# Patient Record
Sex: Female | Born: 1990 | Race: White | Hispanic: No | Marital: Married | Smoking: Never smoker
Health system: Southern US, Community
[De-identification: ages and names within clinical notes are randomized; demographics above are authoritative.]

## PROBLEM LIST (undated history)

## (undated) ENCOUNTER — Emergency Department: Admission: EM | Payer: Self-pay | Source: Home / Self Care

## (undated) DIAGNOSIS — R519 Headache, unspecified: Secondary | ICD-10-CM

## (undated) DIAGNOSIS — F909 Attention-deficit hyperactivity disorder, unspecified type: Secondary | ICD-10-CM

## (undated) DIAGNOSIS — R112 Nausea with vomiting, unspecified: Secondary | ICD-10-CM

## (undated) DIAGNOSIS — L309 Dermatitis, unspecified: Secondary | ICD-10-CM

## (undated) DIAGNOSIS — Z9889 Other specified postprocedural states: Secondary | ICD-10-CM

## (undated) DIAGNOSIS — Z789 Other specified health status: Secondary | ICD-10-CM

## (undated) DIAGNOSIS — M205X1 Other deformities of toe(s) (acquired), right foot: Secondary | ICD-10-CM

## (undated) DIAGNOSIS — A64 Unspecified sexually transmitted disease: Secondary | ICD-10-CM

## (undated) HISTORY — PX: WISDOM TOOTH EXTRACTION: SHX21

---

## 2021-03-28 ENCOUNTER — Observation Stay
Admission: EM | Admit: 2021-03-28 | Discharge: 2021-03-28 | Disposition: A | Discharge status: Home / Self Care | Payer: Medicaid Other | Attending: Obstetrics and Gynecology | Admitting: Obstetrics and Gynecology

## 2021-03-28 ENCOUNTER — Encounter: Payer: Self-pay | Admitting: Obstetrics and Gynecology

## 2021-03-28 ENCOUNTER — Other Ambulatory Visit: Payer: Self-pay

## 2021-03-28 ENCOUNTER — Ambulatory Visit: Admit: 2021-03-28 | Payer: Self-pay

## 2021-03-28 ENCOUNTER — Emergency Department: Admission: EM | Admit: 2021-03-28 | Payer: Self-pay | Source: Home / Self Care

## 2021-03-28 DIAGNOSIS — R1012 Left upper quadrant pain: Secondary | ICD-10-CM | POA: Diagnosis not present

## 2021-03-28 DIAGNOSIS — O0001 Abdominal pregnancy with intrauterine pregnancy: Secondary | ICD-10-CM | POA: Insufficient documentation

## 2021-03-28 DIAGNOSIS — R1010 Upper abdominal pain, unspecified: Secondary | ICD-10-CM

## 2021-03-28 DIAGNOSIS — Z3A21 21 weeks gestation of pregnancy: Secondary | ICD-10-CM | POA: Diagnosis not present

## 2021-03-28 DIAGNOSIS — O26892 Other specified pregnancy related conditions, second trimester: Secondary | ICD-10-CM | POA: Diagnosis not present

## 2021-03-28 DIAGNOSIS — R109 Unspecified abdominal pain: Secondary | ICD-10-CM | POA: Diagnosis present

## 2021-03-28 HISTORY — DX: Other specified health status: Z78.9

## 2021-03-28 NOTE — Discharge Summary (Signed)
Physician Final Progress Note  Patient ID: Kimberly Ford MRN: [id number] DOB/AGE: Feb 09, 1991 30 y.o.  Admit date: 03/28/2021 Admitting provider: Conard Novak, MD Discharge date: 03/28/2021   Admission Diagnoses:  1) intrauterine pregnancy at [redacted]w[redacted]d  2) abdominal pain in pregnancy, second trimester  Discharge Diagnoses:  Active Problems:   Abdominal pain during pregnancy, second trimester   [redacted] weeks gestation of pregnancy    History of Present Illness: The patient is a 30 y.o. female G2P1001 at [redacted]w[redacted]d who presents for abdominal pain. She was lifting a piece of furniture when around [employer] she noted a "popping" sensation that was accompanied by upper abdominal pain.  Since that time she has had intermittent upper abdominal and left lateral abdominal pain. The pain is described as moderate.  Alleviating factors: being more still. Aggravating factors: walking, exertion. Associated symptoms: none. She notes occasional fetal movement - similar to prior to this episode.  She denies vaginal bleeding and leaking fluid. She denies discrete contractions.  This is her second pregnancy. She has had two dating scans and an anatomy scan at her OB/GYN in [place], where she plans to deliver [facility]). Her first pregnancy was complicated by a cesarean delivery due to fetal heart rate decelerations.   Past Medical History:  Diagnosis Date   No known health problems     Past Surgical History:  Procedure Laterality Date   CESAREAN SECTION      No current facility-administered medications on file prior to encounter.   No current outpatient medications on file prior to encounter.   Allergies: No Known Allergies  Social History   Socioeconomic History   Marital status: Married    Spouse name: Tory   Number of children: Not on file   Years of education: Not on file   Highest education level: Not on file  Occupational History   Not on file  Tobacco Use   Smoking status: Never     Passive exposure: Never   Smokeless tobacco: Never  Vaping Use   Vaping Use: Never used  Substance and Sexual Activity   Alcohol use: Not Currently   Drug use: Never   Sexual activity: Yes    Birth control/protection: None  Other Topics Concern   Not on file  Social History Narrative   Not on file   Social Determinants of Health   Financial Resource Strain: Not on file  Food Insecurity: Not on file  Transportation Needs: Not on file  Physical Activity: Not on file  Stress: Not on file  Social Connections: Not on file  Intimate Partner Violence: Not on file    Family History  Problem Relation Age of Onset   Cancer Neg Hx    Ovarian cancer Neg Hx    Breast cancer Neg Hx      Review of Systems  Constitutional: Negative.   HENT: Negative.    Eyes: Negative.   Respiratory: Negative.    Cardiovascular: Negative.   Gastrointestinal:  Positive for abdominal pain (see HPI). Negative for blood in stool, constipation, diarrhea, heartburn, melena, nausea and vomiting.  Genitourinary: Negative.   Musculoskeletal: Negative.   Skin: Negative.   Neurological: Negative.   Psychiatric/Behavioral: Negative.      Physical Exam: BP 119/62 (BP Location: Right Arm)   Pulse 86   Resp 16   Ht 5\' 3"  (1.6 m)   Wt 63.5 kg   BMI 24.80 kg/m   Physical Exam Constitutional:      General: She is not in  acute distress.    Appearance: Normal appearance. She is well-developed.  HENT:     Head: Normocephalic and atraumatic.  Eyes:     General: No scleral icterus.    Conjunctiva/sclera: Conjunctivae normal.  Cardiovascular:     Rate and Rhythm: Normal rate and regular rhythm.     Heart sounds: No murmur heard.   No friction rub. No gallop.  Pulmonary:     Effort: Pulmonary effort is normal. No respiratory distress.     Breath sounds: Normal breath sounds. No wheezing or rales.  Abdominal:     General: Bowel sounds are normal. There is no distension.     Palpations: Abdomen is soft.  There is mass (gravid, NT).     Tenderness: There is no abdominal tenderness. There is no guarding or rebound.  Musculoskeletal:        General: Normal range of motion.     Cervical back: Normal range of motion and neck supple.  Neurological:     General: No focal deficit present.     Mental Status: She is alert and oriented to person, place, and time.     Cranial Nerves: No cranial nerve deficit.  Skin:    General: Skin is warm and dry.     Findings: No erythema.  Psychiatric:        Mood and Affect: Mood normal.        Behavior: Behavior normal.        Judgment: Judgment normal.    Consults: None  Significant Findings/ Diagnostic Studies: none  Procedures:  Baseline FHR: 120 beats/min (fetal heart rate only due to gestational age) Variability: not determined Accelerations: not determined Decelerations: not determined Tocometry: quiet  Interpretation:  INDICATIONS: rule out uterine contractions RESULTS:  Reassuring fetal heart rate for gestational age  Bedside ultrasound: Single living intrauterine pregnancy with gestational age measured by femur length only of [redacted]w[redacted]d.  Fetus in footling breech presentation.  Placenta is anterior. No obvious evidence of accreta, though not particularly examined.  Fluid appears normal. Placenta appears well attached at all the edges.  A normal fetal heart rate noted.  Fetal movement noted throughout examination that lasted about 20 minutes.   Hospital Course: The patient was admitted to Labor and Delivery Triage for observation. She had normal vital signs.  She had reassuring tocometry without evidence of uterine contractions and abruption.  She had no tenderness on her abdomen while performing the ultrasound. There was fetal movement throughout the exam. She was offered further monitoring. However, due to reassuring assessment she opted for discharge with follow up with her OB/GYN in [place].   Discharge Condition: stable  Disposition:  Discharge disposition: 01-Home or Self Care      Diet: Regular diet  Discharge Activity: Activity as tolerated   Allergies as of 03/28/2021   No Known Allergies      Medication List    You have not been prescribed any medications.      Total time spent taking care of this patient: 50 minutes  Signed: Thomasene Mohair, MD  03/28/2021, 8:29 PM

## 2021-03-28 NOTE — Progress Notes (Signed)
Provider performed bedside ultrasound to ensure fetal wellness and to confirm that no issues were present with placenta. Fetal wellbeing confirmed and no issues visualized with placenta.   Pt. Stable to discharge home.

## 2021-03-28 NOTE — OB Triage Note (Signed)
Pt Kimberly Ford 30 y.o. presents to the ED complaining of abdominal pain. Pt is a G2P1 at 22w. Pt denies signs and symptoms consistent with rupture of membranes or active vaginal bleeding. Pt denies contractions and states positive fetal movement. Dopplar heart rate is 120s-130s Vital signs obtained and within normal limits. Provider notified of pt.

## 2021-03-28 NOTE — Progress Notes (Signed)
Pt states that she was moving furniture when she initially felt a pop in her upper abdomen/lower diaphragm. Now, her pain is also on the left side of her abdomen. She rates the pain as mild to moderate.   She attempted to be evaluated at a local urgent care in [facility], but they recommended that she be seen at the hospital due to lack of equipment available at the urgent care location.

## 2021-06-11 ENCOUNTER — Other Ambulatory Visit: Payer: Self-pay

## 2021-06-11 ENCOUNTER — Encounter: Payer: Self-pay | Admitting: Emergency Medicine

## 2021-06-11 ENCOUNTER — Emergency Department: Payer: Medicaid Other

## 2021-06-11 DIAGNOSIS — O99891 Other specified diseases and conditions complicating pregnancy: Secondary | ICD-10-CM | POA: Diagnosis present

## 2021-06-11 DIAGNOSIS — Z3A32 32 weeks gestation of pregnancy: Secondary | ICD-10-CM | POA: Diagnosis not present

## 2021-06-11 DIAGNOSIS — M79604 Pain in right leg: Secondary | ICD-10-CM | POA: Diagnosis not present

## 2021-06-11 NOTE — ED Triage Notes (Signed)
Pt in via POV, reports intermittent right lower leg pain, warm to touch x 2 days.  Pt expresses concern for possible dvt.  Pt is [redacted] weeks pregnant, denies any complications.

## 2021-06-12 ENCOUNTER — Emergency Department
Admission: EM | Admit: 2021-06-12 | Discharge: 2021-06-12 | Disposition: A | Discharge status: Home / Self Care | Payer: Medicaid Other | Attending: Emergency Medicine | Admitting: Emergency Medicine

## 2021-06-12 DIAGNOSIS — M79604 Pain in right leg: Secondary | ICD-10-CM

## 2021-06-12 NOTE — Discharge Instructions (Addendum)
As explained to you, your exam and ultrasound were both unremarkable.  Try to elevate your legs and massage them during the day.  Return to the emergency room if you notice swelling, worsening pain, redness, chest pain, shortness of breath.  Otherwise follow-up with your doctor in 3 days

## 2021-06-12 NOTE — ED Provider Notes (Signed)
[facility] Emergency Department Provider Note  ____________________________________________  Time seen: Approximately 2:38 AM  I have reviewed the triage vital signs and the nursing notes.   HISTORY  Chief Complaint Leg Pain   HPI Kimberly Ford is a 30 y.o. female with no significant past medical history currently [redacted] weeks pregnant who presents for evaluation of right leg pain.  Patient reports that over the last 2 days she has had intermittent episodes where she feels that her right lower leg is hot.  She has not looked at the leg to see if it looks red when this happens.  She called a friend who is a Engineer, civil (consulting) and she told patient to come to the emergency room to be rule out for DVT.  She does have mild soreness of her leg which has been ongoing for several weeks and she attributed to the pregnancy.  She has family history of blood clots in her mother but she has never had 1.  She denies any chest pain or shortness of breath.   Past Medical History:  Diagnosis Date   No known health problems     Patient Active Problem List   Diagnosis Date Noted   Abdominal pain during pregnancy, second trimester 03/28/2021   [redacted] weeks gestation of pregnancy 03/28/2021    Past Surgical History:  Procedure Laterality Date   CESAREAN SECTION      Prior to Admission medications   Not on File    Allergies Patient has no known allergies.  Family History  Problem Relation Age of Onset   Cancer Neg Hx    Ovarian cancer Neg Hx    Breast cancer Neg Hx     Social History Social History   Tobacco Use   Smoking status: Never    Passive exposure: Never   Smokeless tobacco: Never  Vaping Use   Vaping Use: Never used  Substance Use Topics   Alcohol use: Not Currently   Drug use: Never    Review of Systems  Constitutional: Negative for fever. Eyes: Negative for visual changes. ENT: Negative for sore throat. Neck: No neck pain  Cardiovascular: Negative for  chest pain. Respiratory: Negative for shortness of breath. Gastrointestinal: Negative for abdominal pain, vomiting or diarrhea. Genitourinary: Negative for dysuria. Musculoskeletal: Negative for back pain. + RLE pain Skin: Negative for rash. Neurological: Negative for headaches, weakness or numbness. Psych: No SI or HI  ____________________________________________   PHYSICAL EXAM:  VITAL SIGNS: ED Triage Vitals  Enc Vitals Group     BP 06/11/21 2249 120/69     Pulse Rate 06/11/21 2249 (!) 114     Resp 06/11/21 2249 16     Temp 06/11/21 2249 98.7 F (37.1 C)     Temp Source 06/11/21 2249 Oral     SpO2 06/11/21 2249 99 %     Weight 06/11/21 2250 152 lb (68.9 kg)     Height 06/11/21 2250 5\' 3"  (1.6 m)     Head Circumference --      Peak Flow --      Pain Score 06/11/21 2250 4     Pain Loc --      Pain Edu? --      Excl. in [place]? --     Constitutional: Alert and oriented. Well appearing and in no apparent distress. HEENT:      Head: Normocephalic and atraumatic.         Eyes: Conjunctivae are normal. Sclera is non-icteric.  Mouth/Throat: Mucous membranes are moist.       Neck: Supple with no signs of meningismus. Cardiovascular: Regular rate and rhythm. No murmurs, gallops, or rubs.  Respiratory: Normal respiratory effort. Lungs are clear to auscultation bilaterally.  Gastrointestinal: Gravid, non tender, and non distended with positive bowel sounds. No rebound or guarding. Musculoskeletal:  No edema, cyanosis, or erythema of extremities.  Strong distal pulses and brisk cap refill Neurologic: Normal speech and language. Face is symmetric. Moving all extremities. No gross focal neurologic deficits are appreciated. Skin: Skin is warm, dry and intact. No rash noted. Psychiatric: Mood and affect are normal. Speech and behavior are normal.  ____________________________________________   LABS (all labs ordered are listed, but only abnormal results are displayed)  Labs  Reviewed - No data to display ____________________________________________  EKG  none  ____________________________________________  RADIOLOGY  I have personally reviewed the images performed during this visit and I agree with the Radiologist's read.   Interpretation by Radiologist:  US Venous Img Lower Unilateral Right  Result Date: 06/11/2021 CLINICAL DATA:  Right leg pain EXAM: RIGHT LOWER EXTREMITY VENOUS DOPPLER ULTRASOUND TECHNIQUE: Gray-scale sonography with compression, as well as color and duplex ultrasound, were performed to evaluate the deep venous system(s) from the level of the common femoral vein through the popliteal and proximal calf veins. COMPARISON:  None. FINDINGS: VENOUS Normal compressibility of the common femoral, superficial femoral, and popliteal veins, as well as the visualized calf veins. Visualized portions of profunda femoral vein and great saphenous vein unremarkable. No filling defects to suggest DVT on grayscale or color Doppler imaging. Doppler waveforms show normal direction of venous flow, normal respiratory plasticity and response to augmentation. Limited views of the contralateral common femoral vein are unremarkable. OTHER None. Limitations: none IMPRESSION: Negative. Electronically Signed   By: Helyn Numbers M.D.   On: 06/11/2021 23:59    ____________________________________________   PROCEDURES  Procedure(s) performed: None Procedures Critical Care performed:  None ____________________________________________   INITIAL IMPRESSION / ASSESSMENT AND PLAN / ED COURSE  30 y.o. female with no significant past medical history currently [redacted] weeks pregnant who presents for evaluation of right leg pain and warmth sensation x 2 days.  On exam patient is well-appearing in no distress, slightly tachycardic most likely physiologic from pregnancy.  Exam of her legs was completely normal showing no pitting edema, no signs of cellulitis, no ischemia, no swelling.   She has strong distal pulses and brisk capillary refill.  Venous Doppler study negative for DVT.  Symptoms of intermittent warmth sensation to the leg can be nerve pain or may be mild swelling from pregnancy during the day.  There is no swelling right now.  Recommended follow-up with her primary care doctor in 2 days.  Discussed my standard return precautions for new or worsening leg pain, swelling, chest pain or shortness of breath.  Fetal heart rate of 132.      _____________________________________________ Please note:  Patient was evaluated in Emergency Department today for the symptoms described in the history of present illness. Patient was evaluated in the context of the global COVID-19 pandemic, which necessitated consideration that the patient might be at risk for infection with the SARS-CoV-2 virus that causes COVID-19. Institutional protocols and algorithms that pertain to the evaluation of patients at risk for COVID-19 are in a state of rapid change based on information released by regulatory bodies including the CDC and federal and state organizations. These policies and algorithms were followed during the patient's care in the  ED.  Some ED evaluations and interventions may be delayed as a result of limited staffing during the pandemic.   [place] Controlled Substance Database was reviewed by me. ____________________________________________   FINAL CLINICAL IMPRESSION(S) / ED DIAGNOSES   Final diagnoses:  Right leg pain      NEW MEDICATIONS STARTED DURING THIS VISIT:  ED Discharge Orders     None        Note:  This document was prepared using Dragon voice recognition software and may include unintentional dictation errors.    Nita Sickle, MD 06/12/21 [id number]

## 2021-12-28 ENCOUNTER — Ambulatory Visit
Admission: EM | Admit: 2021-12-28 | Discharge: 2021-12-28 | Disposition: A | Discharge status: Home / Self Care | Payer: Medicaid Other

## 2021-12-28 ENCOUNTER — Other Ambulatory Visit: Payer: Self-pay

## 2021-12-28 DIAGNOSIS — J0100 Acute maxillary sinusitis, unspecified: Secondary | ICD-10-CM

## 2021-12-28 MED ORDER — AMOXICILLIN-POT CLAVULANATE 875-125 MG PO TABS: 1.0000 | ORAL_TABLET | Freq: Two times a day (BID) | ORAL | 0 refills | Status: AC

## 2021-12-28 MED ORDER — IPRATROPIUM BROMIDE 0.06 % NA SOLN
2.0000 | Freq: Four times a day (QID) | NASAL | 12 refills | Status: DC
Start: 2021-12-28 — End: 2023-10-08

## 2021-12-28 NOTE — ED Provider Notes (Signed)
[facility]    CSN: [id number] Arrival date & time: 12/28/21  1049      History   Chief Complaint Chief Complaint  Patient presents with   Nasal Congestion    HPI Kimberly Ford is a 31 y.o. female.   HPI  31 year old female here for evaluation of sinus complaints.  That she has been experiencing pain and pressure in her sinuses between her eyes and behind her eyes for the past 3 weeks.  She states that her symptoms began as a cold with runny nose and nasal congestion but the runny nose has resolved.  She also had a cough but that too has resolved except for first in the morning.  Her sputum production is a mixture of clear and what she describes as "chunky".  She has not had a fever, denies ear pain, sore throat, or GI complaints.  Past Medical History:  Diagnosis Date   No known health problems     Patient Active Problem List   Diagnosis Date Noted   Abdominal pain during pregnancy, second trimester 03/28/2021   [redacted] weeks gestation of pregnancy 03/28/2021    Past Surgical History:  Procedure Laterality Date   CESAREAN SECTION      OB History     Gravida  2   Para  1   Term  1   Preterm      AB      Living  1      SAB      IAB      Ectopic      Multiple      Live Births  1            Home Medications    Prior to Admission medications   Medication Sig Start Date End Date Taking? Authorizing Provider  amoxicillin-clavulanate (AUGMENTIN) 875-125 MG tablet Take 1 tablet by mouth every 12 (twelve) hours for 10 days. 12/28/21 01/07/22 Yes Margarette Canada, NP  ipratropium (ATROVENT) 0.06 % nasal spray Place 2 sprays into both nostrils 4 (four) times daily. 12/28/21  Yes Margarette Canada, NP  Multiple Vitamin (MULTIVITAMIN) capsule Take 1 capsule by mouth daily.   Yes [provider]  CALCIUM PO Take by mouth.    [provider]  Magnesium 200 MG TABS Take by mouth.    [provider]  ranitidine (ZANTAC) 150 MG  tablet Take by mouth.    [provider]    Family History Family History  Problem Relation Age of Onset   Cancer Neg Hx    Ovarian cancer Neg Hx    Breast cancer Neg Hx     Social History Social History   Tobacco Use   Smoking status: Never    Passive exposure: Never   Smokeless tobacco: Never  Vaping Use   Vaping Use: Never used  Substance Use Topics   Alcohol use: Not Currently   Drug use: Never     Allergies   Patient has no known allergies.   Review of Systems Review of Systems  Constitutional:  Negative for fever.  HENT:  Positive for congestion, hearing loss, sinus pressure and sinus pain. Negative for ear pain and sore throat.   Respiratory:  Positive for cough. Negative for shortness of breath and wheezing.     Physical Exam Triage Vital Signs ED Triage Vitals  Enc Vitals Group     BP 12/28/21 1101 101/65     Pulse Rate 12/28/21 1101 87  Resp 12/28/21 1101 18     Temp 12/28/21 1101 97.7 F (36.5 C)     Temp Source 12/28/21 1101 Oral     SpO2 12/28/21 1101 99 %     Weight 12/28/21 1058 125 lb (56.7 kg)     Height 12/28/21 1058 _0  (1.6 m)     Head Circumference --      Peak Flow --      Pain Score 12/28/21 1058 3     Pain Loc --      Pain Edu? --      Excl. in [place]? --    No data found.  Updated Vital Signs BP 101/65 (BP Location: Left Arm) Comment (BP Location): over single sleeve   Pulse 87    Temp 97.7 F (36.5 C) (Oral)    Resp 18    Ht _1  (1.6 m)    Wt 125 lb (56.7 kg)    LMP  (LMP Unknown)    SpO2 99%    Breastfeeding Yes    BMI 22.14 kg/m   Visual Acuity Right Eye Distance:   Left Eye Distance:   Bilateral Distance:    Right Eye Near:   Left Eye Near:    Bilateral Near:     Physical Exam Vitals and nursing note reviewed.  Constitutional:      Appearance: Normal appearance. She is obese. She is not ill-appearing.  HENT:     Head: Normocephalic and atraumatic.     Right Ear: Tympanic membrane, ear canal and  external ear normal. There is no impacted cerumen.     Left Ear: Tympanic membrane, ear canal and external ear normal. There is no impacted cerumen.     Nose: Congestion and rhinorrhea present.     Mouth/Throat:     Mouth: Mucous membranes are moist.     Pharynx: Oropharynx is clear. No posterior oropharyngeal erythema.  Cardiovascular:     Rate and Rhythm: Normal rate and regular rhythm.     Pulses: Normal pulses.     Heart sounds: Normal heart sounds. No murmur heard.   No friction rub. No gallop.  Pulmonary:     Effort: Pulmonary effort is normal.     Breath sounds: Normal breath sounds. No wheezing, rhonchi or rales.  Musculoskeletal:     Cervical back: Normal range of motion and neck supple.  Lymphadenopathy:     Cervical: No cervical adenopathy.  Skin:    General: Skin is warm and dry.     Capillary Refill: Capillary refill takes less than 2 seconds.     Findings: No erythema or rash.  Neurological:     General: No focal deficit present.     Mental Status: She is alert and oriented to person, place, and time.  Psychiatric:        Mood and Affect: Mood normal.        Behavior: Behavior normal.        Thought Content: Thought content normal.        Judgment: Judgment normal.     UC Treatments / Results  Labs (all labs ordered are listed, but only abnormal results are displayed) Labs Reviewed - No data to display  EKG   Radiology No results found.  Procedures Procedures (including critical care time)  Medications Ordered in UC Medications - No data to display  Initial Impression / Assessment and Plan / UC Course  I have reviewed the triage vital signs and the nursing notes.  Pertinent labs & imaging results that were available during my care of the patient were reviewed by me and considered in my medical decision making (see chart for details).  Patient is a nontoxic-appearing 31 year old female here for evaluation of sinus complaints as outlined HPI above.   Patient is currently breast-feeding as well.  Physical exam reveals pearly-gray tympanic membranes bilaterally with normal reflex and clear external ear canals.  Nasal mucosa is erythematous and edematous.  I am unable to see her turbinates or sinus outlets.  No purulent discharge noted in the nasal passages.  Oropharyngeal exam is benign.  No cervical lymphadenopathy on exam.  Cardiopulmonary exam reveals clear lung sounds all fields.  Patient does have tenderness to percussion of maxillary sinuses.  Suspect patient has developed a sinus infection secondary to an upper respiratory infection she had 3 weeks ago.  We will do a trial of Augmentin twice daily for 10 days.  Per up-to-date this is compatible with breast-feeding.  I have also prescribed Atrovent nasal spray that patient can use 2 squirts up each nostril 4 times a day to help with nasal congestion.  Sinus irrigation also encouraged.  Patient to return for reevaluation, or see your PCP, for new or worsening symptoms.   Final Clinical Impressions(s) / UC Diagnoses   Final diagnoses:  Acute non-recurrent maxillary sinusitis     Discharge Instructions      The Augmentin twice daily with food for 10 days for treatment of your sinusitis.  Perform sinus irrigation 2-3 times a day with a [employer]Med sinus rinse kit and distilled water.  Do not use tap water.  You can use plain over-the-counter Mucinex every 6 hours to break up the stickiness of the mucus so your body can clear it.  Increase your oral fluid intake to thin out your mucus so that is also able for your body to clear more easily.  Take an over-the-counter probiotic, such as Culturelle-align-activia, 1 hour after each dose of antibiotic to prevent diarrhea.  If you develop any new or worsening symptoms return for reevaluation or see your primary care provider.      ED Prescriptions     Medication Sig Dispense Auth. Provider   amoxicillin-clavulanate (AUGMENTIN) 875-125 MG  tablet Take 1 tablet by mouth every 12 (twelve) hours for 10 days. 20 tablet Margarette Canada, NP   ipratropium (ATROVENT) 0.06 % nasal spray Place 2 sprays into both nostrils 4 (four) times daily. 15 mL Margarette Canada, NP      PDMP not reviewed this encounter.   Margarette Canada, NP 12/28/21 1156

## 2021-12-28 NOTE — ED Triage Notes (Signed)
Patient is here for "sinus pain for 3 wks". Concerned for "sinus infection". Cough "at first" now improved. Some runny nose as well. No itchy watery eyes.  ?

## 2021-12-28 NOTE — Discharge Instructions (Signed)
The Augmentin twice daily with food for 10 days for treatment of your sinusitis.  Perform sinus irrigation 2-3 times a day with a [employer] sinus rinse kit and distilled water.  Do not use tap water.  You can use plain over-the-counter Mucinex every 6 hours to break up the stickiness of the mucus so your body can clear it.  Increase your oral fluid intake to thin out your mucus so that is also able for your body to clear more easily.  Take an over-the-counter probiotic, such as Culturelle-align-activia, 1 hour after each dose of antibiotic to prevent diarrhea.  If you develop any new or worsening symptoms return for reevaluation or see your primary care provider.  

## 2022-01-07 ENCOUNTER — Encounter: Payer: Self-pay | Admitting: Emergency Medicine

## 2022-01-07 ENCOUNTER — Other Ambulatory Visit: Payer: Self-pay

## 2022-01-07 ENCOUNTER — Ambulatory Visit
Admission: EM | Admit: 2022-01-07 | Discharge: 2022-01-07 | Disposition: A | Discharge status: Home / Self Care | Payer: Medicaid Other | Attending: Physician Assistant | Admitting: Physician Assistant

## 2022-01-07 DIAGNOSIS — J019 Acute sinusitis, unspecified: Secondary | ICD-10-CM

## 2022-01-07 DIAGNOSIS — J3489 Other specified disorders of nose and nasal sinuses: Secondary | ICD-10-CM

## 2022-01-07 DIAGNOSIS — R0981 Nasal congestion: Secondary | ICD-10-CM | POA: Diagnosis not present

## 2022-01-07 MED ORDER — TRIAMCINOLONE ACETONIDE 55 MCG/ACT NA AERO: 2.0000 | INHALATION_SPRAY | Freq: Every day | NASAL | 0 refills | Status: DC

## 2022-01-07 MED ORDER — PREDNISONE 20 MG PO TABS: 40.0000 mg | ORAL_TABLET | Freq: Every day | ORAL | 0 refills | Status: AC

## 2022-01-07 NOTE — Discharge Instructions (Signed)
-  I have sent prednisone to help with your sinus infection.  Make sure you wait at least 4 hours before you breast-feed after taking.  Stay hydrated. ?- Have also sent Nasacort.  You can do nasal saline and Nettie pot as well.  Consider Afrin OTC as well.  Use this for short time, few days. ?- Can take over-the-counter Sudafed.  If it dries your milk supply too much, discontinue. ?- If not feeling better over the next 1 to 2 weeks or symptoms worsen, you may need to see an ENT specialist. ?

## 2022-01-07 NOTE — ED Provider Notes (Signed)
?[facility] ? ? ? ?CSN: [id number] ?Arrival date & time: 01/07/22  0957 ? ? ?  ? ?History   ?Chief Complaint ?Chief Complaint  ?Patient presents with  ? Facial Pain  ? ? ?HPI ?Kimberly Ford is a 31 y.o. female who is a breast-feeding mother.  Patient returns today for continued sinus congestion and pressure.  Patient seen a week and a half ago and suspected to have bacterial sinusitis.  Treated with Augmentin.  Reports she has 1 pill left.  States she continues to have sinus pain over bilateral frontal and maxillary regions.  She says that her nasal drainage is clear.  Reports postnasal drainage and a slight cough.  No associated fevers or difficulty breathing.  Patient reports she has not really been taking anything other than the antibiotic.  Reports she is afraid to take any decongestants because she is worried it may dry up her milk supply.  She also says she does not like nasal sprays so she has not been using those.  Does not report a history of allergies or sinus infections.  No other concerns. ? ?HPI ? ?Past Medical History:  ?Diagnosis Date  ? No known health problems   ? ? ?Patient Active Problem List  ? Diagnosis Date Noted  ? Abdominal pain during pregnancy, second trimester 03/28/2021  ? [redacted] weeks gestation of pregnancy 03/28/2021  ? ? ?Past Surgical History:  ?Procedure Laterality Date  ? CESAREAN SECTION    ? ? ?OB History   ? ? Gravida  ?2  ? Para  ?1  ? Term  ?1  ? Preterm  ?   ? AB  ?   ? Living  ?1  ?  ? ? SAB  ?   ? IAB  ?   ? Ectopic  ?   ? Multiple  ?   ? Live Births  ?1  ?   ?  ?  ? ? ? ?Home Medications   ? ?Prior to Admission medications   ?Medication Sig Start Date End Date Taking? Authorizing Provider  ?amoxicillin-clavulanate (AUGMENTIN) 875-125 MG tablet Take 1 tablet by mouth every 12 (twelve) hours for 10 days. 12/28/21 01/07/22 Yes Margarette Canada, NP  ?CALCIUM PO Take by mouth.   Yes [provider]  ?Magnesium 200 MG TABS Take by mouth.   Yes [provider]   ?Multiple Vitamin (MULTIVITAMIN) capsule Take 1 capsule by mouth daily.   Yes [provider]  ?ranitidine (ZANTAC) 150 MG tablet Take by mouth.   Yes [provider]  ?ipratropium (ATROVENT) 0.06 % nasal spray Place 2 sprays into both nostrils 4 (four) times daily. 12/28/21   Margarette Canada, NP  ?predniSONE (DELTASONE) 20 MG tablet Take 2 tablets (40 mg total) by mouth daily with breakfast for 5 days. 01/07/22 01/12/22 Yes Laurene Footman B, PA-C  ?triamcinolone (NASACORT) 55 MCG/ACT AERO nasal inhaler Place 2 sprays into the nose daily. 01/07/22  Yes Danton Clap, PA-C  ? ? ?Family History ?Family History  ?Problem Relation Age of Onset  ? Cancer Neg Hx   ? Ovarian cancer Neg Hx   ? Breast cancer Neg Hx   ? ? ?Social History ?Social History  ? ?Tobacco Use  ? Smoking status: Never  ?  Passive exposure: Never  ? Smokeless tobacco: Never  ?Vaping Use  ? Vaping Use: Never used  ?Substance Use Topics  ? Alcohol use: Not Currently  ? Drug use: Never  ? ? ? ?Allergies   ?  Patient has no known allergies. ? ? ?Review of Systems ?Review of Systems  ?Constitutional:  Negative for chills, diaphoresis, fatigue and fever.  ?HENT:  Positive for congestion, postnasal drip, rhinorrhea, sinus pressure and sinus pain. Negative for ear pain and sore throat.   ?Respiratory:  Positive for cough. Negative for shortness of breath.   ?Gastrointestinal:  Negative for abdominal pain, nausea and vomiting.  ?Musculoskeletal:  Negative for arthralgias and myalgias.  ?Skin:  Negative for rash.  ?Neurological:  Negative for weakness and headaches.  ?Hematological:  Negative for adenopathy.  ? ? ?Physical Exam ?Triage Vital Signs ?ED Triage Vitals  ?Enc Vitals Group  ?   BP 01/07/22 1020 102/72  ?   Pulse Rate 01/07/22 1020 84  ?   Resp 01/07/22 1020 18  ?   Temp 01/07/22 1020 98.3 ?F (36.8 ?C)  ?   Temp Source 01/07/22 1020 Oral  ?   SpO2 01/07/22 1020 99 %  ?   Weight 01/07/22 1017 125 lb (56.7 kg)  ?   Height 01/07/22 1017 5\' 3"   (1.6 m)  ?   Head Circumference --   ?   Peak Flow --   ?   Pain Score 01/07/22 1017 4  ?   Pain Loc --   ?   Pain Edu? --   ?   Excl. in [place]? --   ? ?No data found. ? ?Updated Vital Signs ?BP 102/72 (BP Location: Left Arm)   Pulse 84   Temp 98.3 ?F (36.8 ?C) (Oral)   Resp 18   Ht 5\' 3"  (1.6 m)   Wt 125 lb (56.7 kg)   LMP  (LMP Unknown)   SpO2 99%   Breastfeeding Yes   BMI 22.14 kg/m?  ?   ? ?Physical Exam ?Vitals and nursing note reviewed.  ?Constitutional:   ?   General: She is not in acute distress. ?   Appearance: Normal appearance. She is not ill-appearing or toxic-appearing.  ?HENT:  ?   Head: Normocephalic and atraumatic.  ?   Right Ear: Tympanic membrane, ear canal and external ear normal.  ?   Left Ear: Tympanic membrane, ear canal and external ear normal.  ?   Nose: Mucosal edema, congestion and rhinorrhea (trace clear drainage) present.  ?   Mouth/Throat:  ?   Mouth: Mucous membranes are moist.  ?   Pharynx: Oropharynx is clear.  ?   Comments: Mild clear PND ?Eyes:  ?   General: No scleral icterus.    ?   Right eye: No discharge.     ?   Left eye: No discharge.  ?   Conjunctiva/sclera: Conjunctivae normal.  ?Cardiovascular:  ?   Rate and Rhythm: Normal rate and regular rhythm.  ?   Heart sounds: Normal heart sounds.  ?Pulmonary:  ?   Effort: Pulmonary effort is normal. No respiratory distress.  ?   Breath sounds: Normal breath sounds.  ?Musculoskeletal:  ?   Cervical back: Neck supple.  ?Skin: ?   General: Skin is dry.  ?Neurological:  ?   General: No focal deficit present.  ?   Mental Status: She is alert. Mental status is at baseline.  ?   Motor: No weakness.  ?   Gait: Gait normal.  ?Psychiatric:     ?   Mood and Affect: Mood normal.     ?   Behavior: Behavior normal.     ?   Thought Content: Thought content normal.  ? ? ? ?  UC Treatments / Results  ?Labs ?(all labs ordered are listed, but only abnormal results are displayed) ?Labs Reviewed - No data to display ? ?EKG ? ? ?Radiology ?No results  found. ? ?Procedures ?Procedures (including critical care time) ? ?Medications Ordered in UC ?Medications - No data to display ? ?Initial Impression / Assessment and Plan / UC Course  ?I have reviewed the triage vital signs and the nursing notes. ? ?Pertinent labs & imaging results that were available during my care of the patient were reviewed by me and considered in my medical decision making (see chart for details). ? ?31 year old breast-feeding mother presenting for 5-week history of sinus congestion and pressure.  Patient recently completing 10-day course of Augmentin.  Reports symptoms no better.  Has not tried anything else for symptoms.  Patient is overall well-appearing today and has normal vital signs.  On exam she does have nasal congestion and mucosal edema with clear drainage.  Also clear postnasal drainage.  Chest is clear to auscultation heart regular rate and rhythm.  Patient does not report any history of allergies but I question if her symptoms may be due to allergies and an antihistamine/decongestant may be helpful as well as a nasal spray.  I understand that since she is breast-feeding she is reluctant to take any of those medicines as it may dry up her milk supply.  I have sent short course of prednisone to pharmacy to see if that will help and advised her to wait 4 hours before breast-feeding.  Also advised her to try the Nasacort I sent in nasal saline as well as over-the-counter Sudafed sparingly.  Reviewed return and ER precautions.  We will ? ? ?Final Clinical Impressions(s) / UC Diagnoses  ? ?Final diagnoses:  ?Acute sinusitis, recurrence not specified, unspecified location  ?Nasal congestion  ?Sinus pain  ? ? ? ?Discharge Instructions   ? ?  ?-I have sent prednisone to help with your sinus infection.  Make sure you wait at least 4 hours before you breast-feed after taking.  Stay hydrated. ?- Have also sent Nasacort.  You can do nasal saline and Nettie pot as well.  Consider Afrin OTC as  well.  Use this for short time, few days. ?- Can take over-the-counter Sudafed.  If it dries your milk supply too much, discontinue. ?- If not feeling better over the next 1 to 2 weeks or symptoms worsen, you

## 2022-01-07 NOTE — ED Triage Notes (Signed)
Pt c/o sinus pain/pressure. Started about 5 weeks ago. She states she was seen last week and given an antibiotic. She states she was getting better but now she is having sinus pain.pressure worse than she was before taking the antibiotic.  ?

## 2022-03-19 ENCOUNTER — Ambulatory Visit: Payer: Self-pay

## 2022-04-06 ENCOUNTER — Ambulatory Visit
Admission: EM | Admit: 2022-04-06 | Discharge: 2022-04-06 | Disposition: A | Discharge status: Home / Self Care | Payer: Medicaid Other | Attending: Physician Assistant | Admitting: Physician Assistant

## 2022-04-06 DIAGNOSIS — R21 Rash and other nonspecific skin eruption: Secondary | ICD-10-CM | POA: Diagnosis not present

## 2022-04-06 DIAGNOSIS — N61 Mastitis without abscess: Secondary | ICD-10-CM

## 2022-04-06 MED ORDER — NYSTATIN 100000 UNIT/GM EX CREA: TOPICAL_CREAM | CUTANEOUS | 0 refills | Status: AC

## 2022-04-06 MED ORDER — CEPHALEXIN 500 MG PO CAPS: 500.0000 mg | ORAL_CAPSULE | Freq: Four times a day (QID) | ORAL | 0 refills | Status: AC

## 2022-04-06 NOTE — ED Provider Notes (Signed)
[facility]    CSN: [id number] Arrival date & time: 04/06/22  1540      History   Chief Complaint No chief complaint on file.   HPI Kimberly Ford is a 31 y.o. female presenting for 1 to 1.5-week history of yellow crusting around the areola and breast on the left side.  She also reports that it is very tender to touch.  States she has yellow crusting in her bra.  She is a breast-feeding mother.  Her baby is 58 months old.  She denies any issues with her baby.  States it does not have thrush and has been eating and drinking normally.  She says she has not been feeding the baby from the left breast and only the right breast.  Reports reduced milk supply from the right breast.  She does not report any fevers or systemic symptoms.  No other complaints.  HPI  Past Medical History:  Diagnosis Date   No known health problems     Patient Active Problem List   Diagnosis Date Noted   Abdominal pain during pregnancy, second trimester 03/28/2021   [redacted] weeks gestation of pregnancy 03/28/2021    Past Surgical History:  Procedure Laterality Date   CESAREAN SECTION      OB History     Gravida  2   Para  1   Term  1   Preterm      AB      Living  1      SAB      IAB      Ectopic      Multiple      Live Births  1            Home Medications    Prior to Admission medications   Medication Sig Start Date End Date Taking? Authorizing Provider  CALCIUM PO Take by mouth.   Yes [provider]  cephALEXin (KEFLEX) 500 MG capsule Take 1 capsule (500 mg total) by mouth 4 (four) times daily for 10 days. 04/06/22 04/16/22 Yes Eusebio Friendly B, PA-C  ipratropium (ATROVENT) 0.06 % nasal spray Place 2 sprays into both nostrils 4 (four) times daily. 12/28/21  Yes Becky Augusta, NP  Magnesium 200 MG TABS Take by mouth.   Yes [provider]  Multiple Vitamin (MULTIVITAMIN) capsule Take 1 capsule by mouth daily.   Yes [provider]  nystatin  cream (MYCOSTATIN) Apply to affected area 2 times daily 04/06/22 04/16/22 Yes Eusebio Friendly B, PA-C  ranitidine (ZANTAC) 150 MG tablet Take by mouth.   Yes [provider]  triamcinolone (NASACORT) 55 MCG/ACT AERO nasal inhaler Place 2 sprays into the nose daily. 01/07/22  Yes Shirlee Latch, PA-C    Family History Family History  Problem Relation Age of Onset   Cancer Neg Hx    Ovarian cancer Neg Hx    Breast cancer Neg Hx     Social History Social History   Tobacco Use   Smoking status: Never    Passive exposure: Never   Smokeless tobacco: Never  Vaping Use   Vaping Use: Never used  Substance Use Topics   Alcohol use: Not Currently   Drug use: Never     Allergies   Patient has no known allergies.   Review of Systems Review of Systems  Constitutional:  Negative for chills, fatigue and fever.  Skin:  Positive for color change and rash. Negative for wound.     Physical Exam  Triage Vital Signs ED Triage Vitals  Enc Vitals Group     BP 04/06/22 1553 108/79     Pulse Rate 04/06/22 1553 71     Resp --      Temp 04/06/22 1553 98.1 F (36.7 C)     Temp Source 04/06/22 1553 Oral     SpO2 04/06/22 1553 100 %     Weight 04/06/22 1550 125 lb (56.7 kg)     Height 04/06/22 1550 5\' 3"  (1.6 m)     Head Circumference --      Peak Flow --      Pain Score 04/06/22 1549 4     Pain Loc --      Pain Edu? --      Excl. in [place]? --    No data found.  Updated Vital Signs BP 108/79 (BP Location: Left Arm)   Pulse 71   Temp 98.1 F (36.7 C) (Oral)   Ht 5\' 3"  (1.6 m)   Wt 125 lb (56.7 kg)   SpO2 100%   Breastfeeding Yes   BMI 22.14 kg/m      Physical Exam Vitals and nursing note reviewed.  Constitutional:      General: She is not in acute distress.    Appearance: Normal appearance. She is not ill-appearing or toxic-appearing.  HENT:     Head: Normocephalic and atraumatic.  Eyes:     General: No scleral icterus.       Right eye: No discharge.        Left  eye: No discharge.     Conjunctiva/sclera: Conjunctivae normal.  Cardiovascular:     Rate and Rhythm: Normal rate and regular rhythm.  Pulmonary:     Effort: Pulmonary effort is normal. No respiratory distress.  Musculoskeletal:     Cervical back: Neck supple.  Skin:    General: Skin is dry.     Comments: LEFT BREAST: Mild swelling of left nipple compared to right.  Slightly increased erythema and warmth of areola compared to opposite side.  Using erythematous rash of the areola.  Yellow crusting around nipple base.  Area is tender to palpation.  No breast abscesses.  Neurological:     General: No focal deficit present.     Mental Status: She is alert. Mental status is at baseline.     Motor: No weakness.     Gait: Gait normal.  Psychiatric:        Mood and Affect: Mood normal.        Behavior: Behavior normal.        Thought Content: Thought content normal.      UC Treatments / Results  Labs (all labs ordered are listed, but only abnormal results are displayed) Labs Reviewed - No data to display  EKG   Radiology No results found.  Procedures Procedures (including critical care time)  Medications Ordered in UC Medications - No data to display  Initial Impression / Assessment and Plan / UC Course  I have reviewed the triage vital signs and the nursing notes.  Pertinent labs & imaging results that were available during my care of the patient were reviewed by me and considered in my medical decision making (see chart for details).  31 year old female presenting for left breast pain, rash with drainage for the past 1 to 1.5 weeks.  She is breast-feeding.  On exam she has a moist erythematous rash that is oozing serous fluid about the areola.  There is also a little  bit of increased warmth and erythema in this area.  Yellowish crusting noted as well.  Suspect fungal infection.  Sent nystatin to pharmacy.  Encouraged cleaning the area well.  Advised monitoring her child and if  the baby shows signs of thrush to follow-up with pediatrician as he or she may need oral nystatin.  Since she does have decreased milk supply and affected breast, mild nipple swelling and increased erythema and warmth about the areola, advised this could be early mastitis so I have sent Keflex to pharmacy and encouraged warm compresses and continuing to breast-feed.  Encouraged her to follow back up here since she has never primary care provider or see if her OB/GYN can see her if she is not improving in the next few days.  Other concerns if this is not improving would be an eczematous rash.   Final Clinical Impressions(s) / UC Diagnoses   Final diagnoses:  Cellulitis of breast  Rash and nonspecific skin eruption     Discharge Instructions      -Your condition is most consistent with fungal skin infection so I have sent nystatin cream for you to try.  If you notice that your baby has thrush and will need to be treated with nystatin mouthwash and you should discuss this with his pediatrician. - Since you have reduced milk supply and this breast and a lot of tenderness, is possible you could be developing early mastitis so I have sent Keflex to pharmacy. - Keep the area clean and dry.  Continue to breast-feed.  Warm compresses. - Follow-up here or other urgent care (since you do not have a PCP) if you are not improving over the next few days.  Looks like you have Medicaid so they should have assigned your primary care provider.  You will need to check on your card.  If you want to change that she will need to contact Medicaid and follow-up where they advise you to.     ED Prescriptions     Medication Sig Dispense Auth. Provider   cephALEXin (KEFLEX) 500 MG capsule Take 1 capsule (500 mg total) by mouth 4 (four) times daily for 10 days. 40 capsule Eusebio Friendly B, PA-C   nystatin cream (MYCOSTATIN) Apply to affected area 2 times daily 30 g Shirlee Latch, [place]      PDMP not reviewed this  encounter.   Shirlee Latch, PA-C 04/06/22 1652

## 2022-04-06 NOTE — Discharge Instructions (Signed)
-  Your condition is most consistent with fungal skin infection so I have sent nystatin cream for you to try.  If you notice that your baby has thrush and will need to be treated with nystatin mouthwash and you should discuss this with his pediatrician. - Since you have reduced milk supply and this breast and a lot of tenderness, is possible you could be developing early mastitis so I have sent Keflex to pharmacy. - Keep the area clean and dry.  Continue to breast-feed.  Warm compresses. - Follow-up here or other urgent care (since you do not have a PCP) if you are not improving over the next few days.  Looks like you have Medicaid so they should have assigned your primary care provider.  You will need to check on your card.  If you want to change that she will need to contact Medicaid and follow-up where they advise you to.

## 2022-04-06 NOTE — ED Triage Notes (Signed)
Patient presents to UC left breast/nipple pain -- started about a week ago.   Patient started there was yelling puss coming out of it yesterday.

## 2022-07-08 ENCOUNTER — Encounter: Payer: Self-pay | Admitting: Emergency Medicine

## 2022-07-08 ENCOUNTER — Ambulatory Visit
Admission: EM | Admit: 2022-07-08 | Discharge: 2022-07-08 | Disposition: A | Discharge status: Home / Self Care | Payer: Medicaid Other | Attending: Family Medicine | Admitting: Family Medicine

## 2022-07-08 DIAGNOSIS — J069 Acute upper respiratory infection, unspecified: Secondary | ICD-10-CM | POA: Insufficient documentation

## 2022-07-08 DIAGNOSIS — L02419 Cutaneous abscess of limb, unspecified: Secondary | ICD-10-CM | POA: Insufficient documentation

## 2022-07-08 DIAGNOSIS — Z20822 Contact with and (suspected) exposure to covid-19: Secondary | ICD-10-CM | POA: Insufficient documentation

## 2022-07-08 LAB — SARS CORONAVIRUS 2 BY RT PCR: SARS Coronavirus 2 by RT PCR: NEGATIVE

## 2022-07-08 MED ORDER — DOXYCYCLINE HYCLATE 100 MG PO CAPS: 100.0000 mg | ORAL_CAPSULE | Freq: Two times a day (BID) | ORAL | 0 refills | Status: AC

## 2022-07-08 NOTE — ED Triage Notes (Signed)
Pt has 2 small abscess in her right axilla, nasal congestion, sinus pain/pressure. She states the nasal congestion started about 2 weeks ago. Access started about a week or so ago and she noticed a new bump yesterday.

## 2022-07-08 NOTE — Discharge Instructions (Signed)
Stop by the pharmacy to pick up your prescriptions. See attached after care instructions

## 2022-07-08 NOTE — ED Provider Notes (Signed)
[facility]    CSN: [id number] Arrival date & time: 07/08/22  4854      History   Chief Complaint Chief Complaint  Patient presents with   Abscess   Nasal Congestion    HPI Kimberly Ford is a 31 y.o. female.   HPI   Kimberly Ford presents for right armpit pain that started about 5 days ago.  She first noticed a small bump that grew in size and then a second bump showed up.  The area is painful to touch.  She is concerned that she may have breast cancer.  There has been no breast tenderness or masses.  No changes to her breast or nipple discharge.  She also has nasal congestion for the past 1-2 weeks.  Says that this usually clears up when she gets prednisone.  Denies fever however endorses chills.  Has myalgias.  Has some sinus pressure.  Had a sore throat which resolved.  There has been no nausea, vomiting, diarrhea, abdominal pain, chest pain, cough.  Says that the other night she had to sleep and sweats which she normally is hot at bedtime.  She has seen an ear nose and throat doctor before who told her she did not have a sinus infection.      Past Medical History:  Diagnosis Date   No known health problems     Patient Active Problem List   Diagnosis Date Noted   Abdominal pain during pregnancy, second trimester 03/28/2021   [redacted] weeks gestation of pregnancy 03/28/2021    Past Surgical History:  Procedure Laterality Date   CESAREAN SECTION      OB History     Gravida  2   Para  1   Term  1   Preterm      AB      Living  1      SAB      IAB      Ectopic      Multiple      Live Births  1            Home Medications    Prior to Admission medications   Medication Sig Start Date End Date Taking? Authorizing Provider  doxycycline (VIBRAMYCIN) 100 MG capsule Take 1 capsule (100 mg total) by mouth 2 (two) times daily for 5 days. 07/08/22 07/13/22 Yes Starnisha Batrez, DO  CALCIUM PO Take by mouth.    [provider]   ipratropium (ATROVENT) 0.06 % nasal spray Place 2 sprays into both nostrils 4 (four) times daily. 12/28/21   Margarette Canada, NP  Magnesium 200 MG TABS Take by mouth.    [provider]  Multiple Vitamin (MULTIVITAMIN) capsule Take 1 capsule by mouth daily.    [provider]  ranitidine (ZANTAC) 150 MG tablet Take by mouth.    [provider]  triamcinolone (NASACORT) 55 MCG/ACT AERO nasal inhaler Place 2 sprays into the nose daily. 01/07/22   Danton Clap, PA-C    Family History Family History  Problem Relation Age of Onset   Cancer Neg Hx    Ovarian cancer Neg Hx    Breast cancer Neg Hx     Social History Social History   Tobacco Use   Smoking status: Never    Passive exposure: Never   Smokeless tobacco: Never  Vaping Use   Vaping Use: Never used  Substance Use Topics   Alcohol use: Not Currently   Drug use: Never  Allergies   Patient has no known allergies.   Review of Systems Review of Systems: negative unless otherwise stated in HPI.      Physical Exam Triage Vital Signs ED Triage Vitals  Enc Vitals Group     BP 07/08/22 1106 102/75     Pulse Rate 07/08/22 1106 90     Resp 07/08/22 1106 18     Temp 07/08/22 1106 98.3 F (36.8 C)     Temp Source 07/08/22 1106 Oral     SpO2 07/08/22 1106 100 %     Weight 07/08/22 1103 125 lb (56.7 kg)     Height 07/08/22 1103 5\' 3"  (1.6 m)     Head Circumference --      Peak Flow --      Pain Score 07/08/22 1102 6     Pain Loc --      Pain Edu? --      Excl. in GC? --    No data found.  Updated Vital Signs BP 102/75 (BP Location: Left Arm)   Pulse 90   Temp 98.3 F (36.8 C) (Oral)   Resp 18   Ht 5\' 3"  (1.6 m)   Wt 56.7 kg   LMP 06/26/2022 (Approximate)   SpO2 100%   Breastfeeding Yes Comment: pt states she only breast feeds about once a day.  BMI 22.14 kg/m   Visual Acuity Right Eye Distance:   Left Eye Distance:   Bilateral Distance:    Right Eye Near:   Left Eye Near:     Bilateral Near:     Physical Exam GEN:     alert, non-ill appearing female in no distress    HENT:  mucus membranes moist, oropharyngeal without lesions or exudate, 1+ tonsillar hypertrophy,  mild oropharyngeal erythema, clear nasal discharge, bilateral TM normal, no frontal or maxillary sinus tenderness  EYES:   pupils equal and reactive, EOMi, no scleral injection NECK:  normal ROM, no lymphadenopathy RESP:  no increased work of breathing CVS:   regular rate  Skin:   warm and dry, right axilla with tender to light palpation 1.5 cm and 0.5 cm areas with warmth     UC Treatments / Results  Labs (all labs ordered are listed, but only abnormal results are displayed) Labs Reviewed  SARS CORONAVIRUS 2 BY RT PCR    EKG   Radiology No results found.  Procedures Incision and Drainage  Date/Time: 07/08/2022 12:55 PM  Performed by: 08/26/2022, DO Authorized by: 07/10/2022, DO   Consent:    Consent obtained:  Verbal   Consent given by:  Patient   Risks discussed:  Bleeding, incomplete drainage, pain and infection (damage to other structures)   Alternatives discussed:  Alternative treatment and delayed treatment Universal protocol:    Patient identity confirmed:  Verbally with patient Location:    Type:  Abscess   Size:  1.5 cm , 0.5 cm   Location: right axilla. Pre-procedure details:    Skin preparation:  Chlorhexidine with alcohol Sedation:    Sedation type:  None Anesthesia:    Anesthesia method:  Local infiltration   Local anesthetic:  Lidocaine 1% w/o epi Procedure type:    Complexity:  Simple Procedure details:    Incision types:  Stab incision   Drainage:  Bloody and purulent   Wound treatment:  Wound left open   Packing materials:  None Post-procedure details:    Procedure completion:  Tolerated  (including critical care time)  Medications Ordered  in UC Medications - No data to display  Initial Impression / Assessment and Plan / UC Course  I  have reviewed the triage vital signs and the nursing notes.  Pertinent labs & imaging results that were available during my care of the patient were reviewed by me and considered in my medical decision making (see chart for details).      Pt is a 31 y.o. female who presents for 2 weeks of nasal congestion with a few days of myalgias and chills.  Kellyann is afebrile here without recent antipyretics. Satting well on room air. Overall pt is well appearing, well hydrated, without respiratory distress. COVID testing obtained and was negative. History consistent with viral respiratory illness. Discussed symptomatic treatment. Explained lack of efficacy of antibiotics in viral disease. - continue Tylenol/ Motrin as needed for discomfort/fever - nasal saline to help with his nasal congestion - Use a mist humidifier to help with breathing - Stressed importance of hydration - Discussed return and ED precautions, understanding voiced.   She has I&D of right axillary abscess. Given after care instructions. Prescription for doxycycline sent to pharmacy.   Discussed MDM, treatment plan and plan for follow-up with patient/parent who agrees with plan.     Final Clinical Impressions(s) / UC Diagnoses   Final diagnoses:  Axillary abscess  Viral URI     Discharge Instructions      Stop by the pharmacy to pick up your prescriptions. See attached after care instructions     ED Prescriptions     Medication Sig Dispense Auth. Provider   doxycycline (VIBRAMYCIN) 100 MG capsule Take 1 capsule (100 mg total) by mouth 2 (two) times daily for 5 days. 10 capsule Katha Cabal, DO      PDMP not reviewed this encounter.   Katha Cabal, DO 07/08/22 1257

## 2022-07-10 ENCOUNTER — Telehealth: Payer: Self-pay | Admitting: Emergency Medicine

## 2022-07-10 MED ORDER — FLUCONAZOLE 150 MG PO TABS: 150.0000 mg | ORAL_TABLET | Freq: Once | ORAL | 0 refills | Status: AC

## 2022-07-10 NOTE — Telephone Encounter (Signed)
Patient called here several times, stating that Dr. Susa Simmonds offered to call in a prescription for Diflucan for her in case she got a yeast infection from the antibiotics.  She is currently not having any symptoms.  However, will call in a prescription of Diflucan to the pharmacy on record in case she starts to have symptoms.  will have staff notify of prescription waiting for her at the pharmacy.

## 2022-07-19 ENCOUNTER — Ambulatory Visit
Admission: EM | Admit: 2022-07-19 | Discharge: 2022-07-19 | Disposition: A | Discharge status: Home / Self Care | Payer: Medicaid Other | Attending: Emergency Medicine | Admitting: Emergency Medicine

## 2022-07-19 DIAGNOSIS — R21 Rash and other nonspecific skin eruption: Secondary | ICD-10-CM

## 2022-07-19 DIAGNOSIS — L509 Urticaria, unspecified: Secondary | ICD-10-CM

## 2022-07-19 MED ORDER — PREDNISONE 10 MG (21) PO TBPK
ORAL_TABLET | Freq: Every day | ORAL | 0 refills | Status: DC
Start: 2022-07-19 — End: 2023-10-08

## 2022-07-19 MED ORDER — METHYLPREDNISOLONE SODIUM SUCC 40 MG IJ SOLR
80.0000 mg | Freq: Once | INTRAMUSCULAR | Status: AC
  Administered 2022-07-19: 80 mg via INTRAMUSCULAR

## 2022-07-19 NOTE — Discharge Instructions (Signed)
Today you are being treated for your hives which are contributing to the swelling and itching that you are experiencing, your allergen is since you have had 2 occurrence of hives over the last 3 months you have been given information to an allergist, please call and schedule appointment for allergy test  You have been given an injection of steroids today in the office today to help reduce the inflammatory process that occurs with this rash which will help minimize your itching as well as begin to clear  Starting tomorrow take prednisone every morning with food as directed, to continue the above process  You may use of topical calamine or Benadryl cream to help manage itching, you may also continue oral Benadryl  Please avoid long exposures to heat such as a hot steamy shower or being outside as this may cause further irritation to your rash  You may follow-up with his urgent care as needed if symptoms persist or worsen

## 2022-07-19 NOTE — ED Triage Notes (Signed)
Pt c/o hives across whole body, hands and feet swelling x3days  Pt states that it is hard to walk and her fingers are swollen and she can not remove her rings.  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  Pt states that she took a shower and was outdoors and the hives left on Friday evening but came back Saturday morning.   Pt states that her face was tingling and denies any SOB or trouble breathing today. Pt states that on Saturday she felt like she had a lump in her throat.  Pt has taken benadryl and last dose was 25mg  last night.   Pt states that she had hives in August but not this severe and states benadryl does not help.

## 2022-07-19 NOTE — ED Provider Notes (Signed)
[facility]    CSN: [id number] Arrival date & time: 07/19/22  2202      History   Chief Complaint Chief Complaint  Patient presents with   Allergic Reaction    Whole body hives and swelling - Entered by patient    HPI Kimberly Ford is a 31 y.o. female.   Patient presents with generalized hives and bilateral feet swelling for 3 days.  Endorses significant pruritus and 1 occurrence of tingling to the lips which has resolved.  Endorses the day prior to symptoms beginning she was installing insulation and was standing with day symptoms began, endorses she has completed both activities prior.  Has attempted use of Benadryl, Allegra, famotidine and tramadol and cream which has been ineffective.  Currently lactating.  No known allergies.     Past Medical History:  Diagnosis Date   No known health problems     Patient Active Problem List   Diagnosis Date Noted   Abdominal pain during pregnancy, second trimester 03/28/2021   [redacted] weeks gestation of pregnancy 03/28/2021    Past Surgical History:  Procedure Laterality Date   CESAREAN SECTION      OB History     Gravida  2   Para  1   Term  1   Preterm      AB      Living  1      SAB      IAB      Ectopic      Multiple      Live Births  1            Home Medications    Prior to Admission medications   Medication Sig Start Date End Date Taking? Authorizing Provider  CALCIUM PO Take by mouth.    [provider]  ipratropium (ATROVENT) 0.06 % nasal spray Place 2 sprays into both nostrils 4 (four) times daily. 12/28/21   Margarette Canada, NP  Magnesium 200 MG TABS Take by mouth.    [provider]  Multiple Vitamin (MULTIVITAMIN) capsule Take 1 capsule by mouth daily.    [provider]  ranitidine (ZANTAC) 150 MG tablet Take by mouth.    [provider]  triamcinolone (NASACORT) 55 MCG/ACT AERO nasal inhaler Place 2 sprays into the nose daily. 01/07/22    Danton Clap, PA-C    Family History Family History  Problem Relation Age of Onset   Cancer Neg Hx    Ovarian cancer Neg Hx    Breast cancer Neg Hx     Social History Social History   Tobacco Use   Smoking status: Never    Passive exposure: Never   Smokeless tobacco: Never  Vaping Use   Vaping Use: Never used  Substance Use Topics   Alcohol use: Not Currently   Drug use: Never     Allergies   Patient has no known allergies.   Review of Systems Review of Systems  Constitutional: Negative.   Respiratory: Negative.    Cardiovascular: Negative.   Skin:  Positive for rash. Negative for color change, pallor and wound.  Neurological: Negative.      Physical Exam Triage Vital Signs ED Triage Vitals  Enc Vitals Group     BP 07/19/22 1025 106/75     Pulse Rate 07/19/22 1025 88     Resp 07/19/22 1025 18     Temp 07/19/22 1025 97.7 F (36.5 C)     Temp Source 07/19/22 1025 Oral  SpO2 07/19/22 1025 100 %     Weight 07/19/22 1024 125 lb (56.7 kg)     Height 07/19/22 1024 5\' 3"  (1.6 m)     Head Circumference --      Peak Flow --      Pain Score 07/19/22 1023 5     Pain Loc --      Pain Edu? --      Excl. in GC? --    No data found.  Updated Vital Signs BP 106/75 (BP Location: Left Arm)   Pulse 88   Temp 97.7 F (36.5 C) (Oral)   Resp 18   Ht 5\' 3"  (1.6 m)   Wt 125 lb (56.7 kg)   LMP 06/26/2022 (Approximate)   SpO2 100%   BMI 22.14 kg/m   Visual Acuity Right Eye Distance:   Left Eye Distance:   Bilateral Distance:    Right Eye Near:   Left Eye Near:    Bilateral Near:     Physical Exam Constitutional:      Appearance: Normal appearance.  HENT:     Mouth/Throat:     Mouth: Mucous membranes are moist.     Pharynx: Oropharynx is clear.  Eyes:     Extraocular Movements: Extraocular movements intact.  Pulmonary:     Effort: Pulmonary effort is normal.     Breath sounds: Normal breath sounds.  Skin:    Comments: Severe hives generalized  to the body, nonpitting edema present to the bilateral feet  Neurological:     Mental Status: She is alert and oriented to person, place, and time. Mental status is at baseline.  Psychiatric:        Mood and Affect: Mood normal.        Behavior: Behavior normal.      UC Treatments / Results  Labs (all labs ordered are listed, but only abnormal results are displayed) Labs Reviewed - No data to display  EKG   Radiology No results found.  Procedures Procedures (including critical care time)  Medications Ordered in UC Medications - No data to display  Initial Impression / Assessment and Plan / UC Course  I have reviewed the triage vital signs and the nursing notes.  Pertinent labs & imaging results that were available during my care of the patient were reviewed by me and considered in my medical decision making (see chart for details).  Rash, hives  Unknown allergen, no respiratory involvement at this time, O2 saturation is 100% on room air, pharynx is clear and lungs are clear to auscultation methylprednisolone injection given in office and prednisone 60 mg taper prescribed for outpatient, patient to monitor closely as she endorses similar symptoms in the, concerned that have may be stress-induced, discussed that if she has concerns then she will need to find methods to reduce her stress in efforts to prevent reoccurrence, advised limiting exposure to heat to prevent further irritation we will use topical antihistamines or calamine lotion for additional management of pruritus, given precautions to follow-up if symptoms persist or worsen Final Clinical Impressions(s) / UC Diagnoses   Final diagnoses:  None   Discharge Instructions   None    ED Prescriptions   None    PDMP not reviewed this encounter.   , NP 07/19/22 1101

## 2022-09-06 IMAGING — US US EXTREM LOW VENOUS*R*
1 series · 14 of 24 positions shown · non-contrast
Comparison: None.

CLINICAL DATA: Right leg pain

EXAM:
RIGHT LOWER EXTREMITY VENOUS DOPPLER ULTRASOUND
TECHNIQUE: Gray-scale sonography with compression, as well as color and duplex
ultrasound, were performed to evaluate the deep venous system(s)
from the level of the common femoral vein through the popliteal and
proximal calf veins.

[Series 1: us venous img lower uni right (dvt) · portal-venous · 14 of 33 slices shown]
[im 1/33]
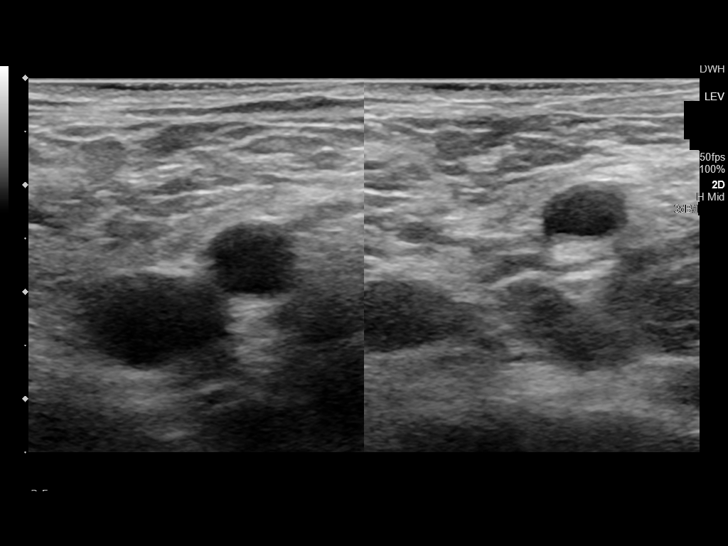
[im 3/33]
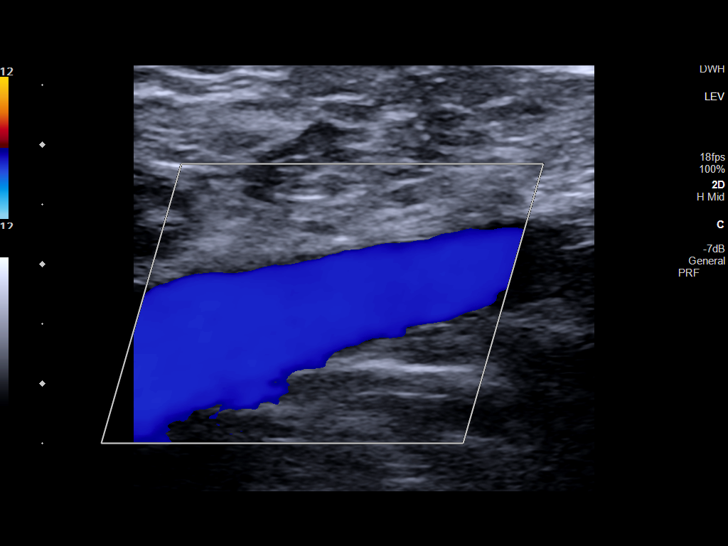
[im 6/33]
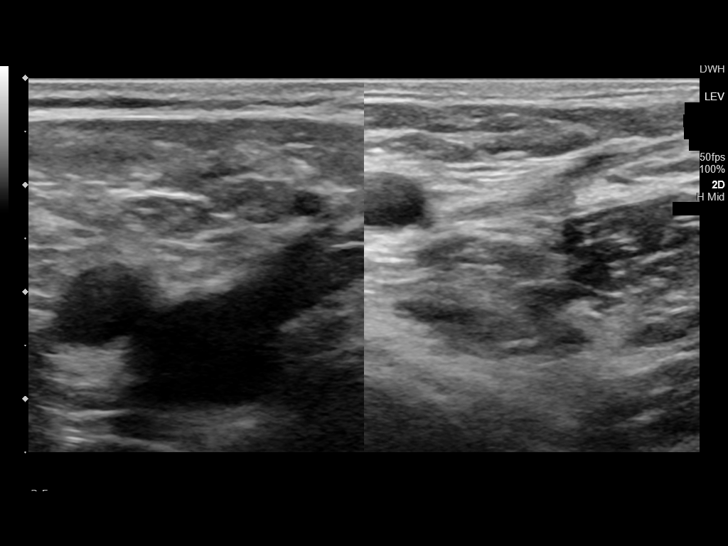
[im 9/33]
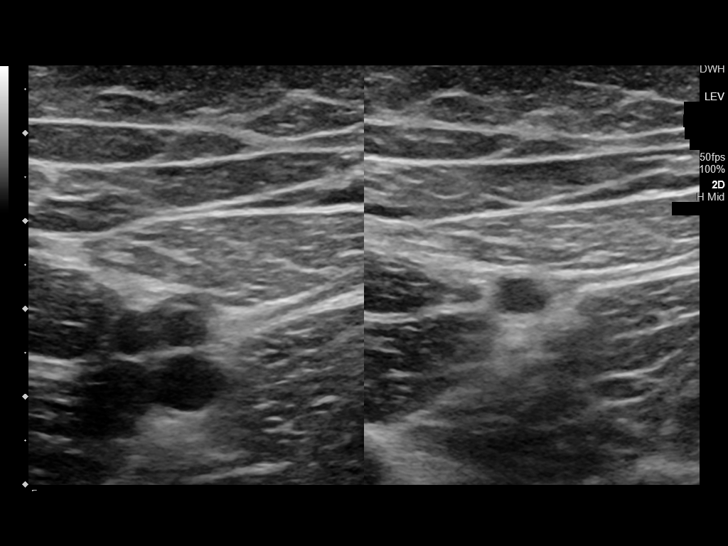
[im 10/33]
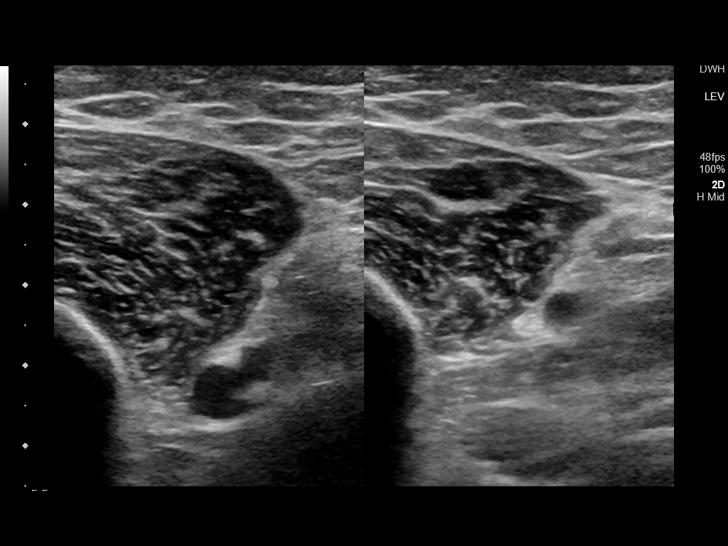
[im 13/33]
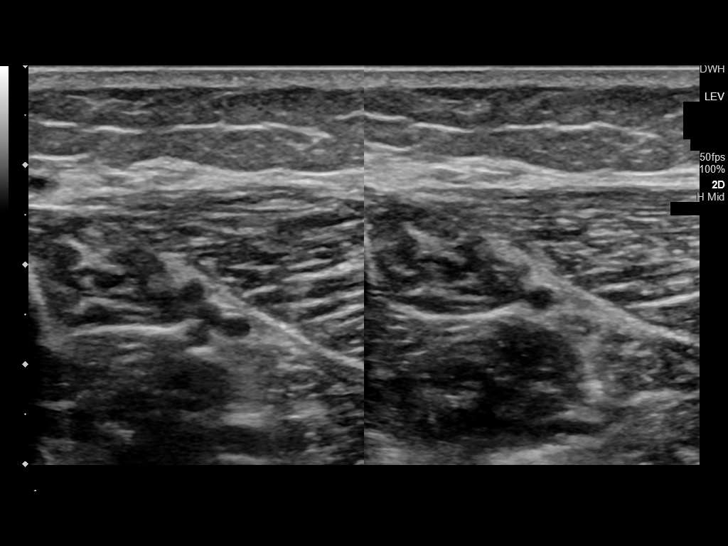
[im 16/33]
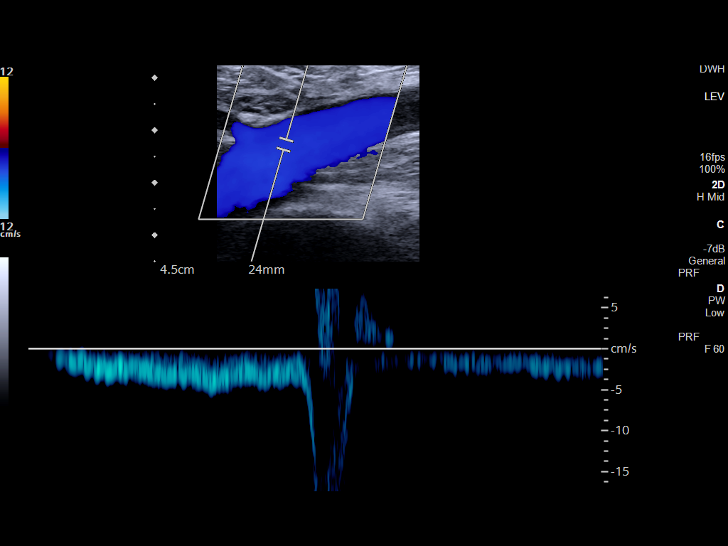
[im 17/33]
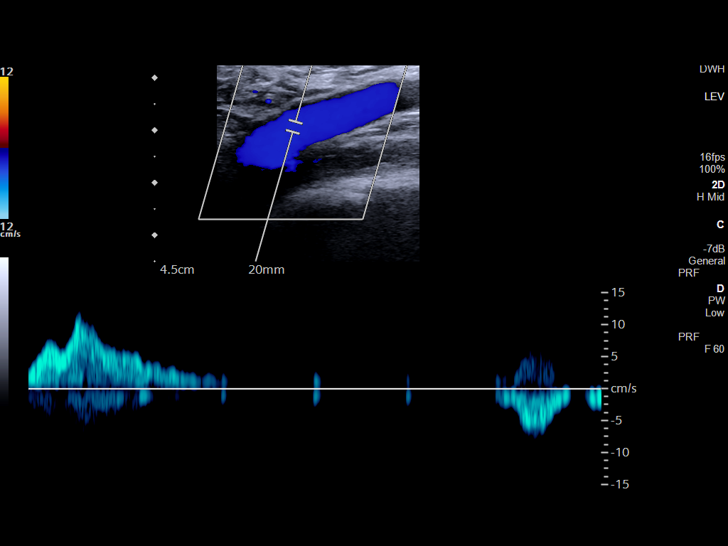
[im 20/33]
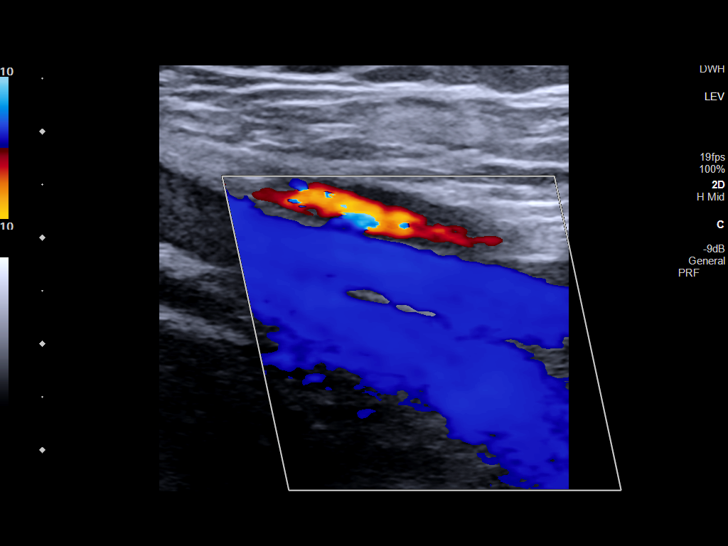
[im 23/33]
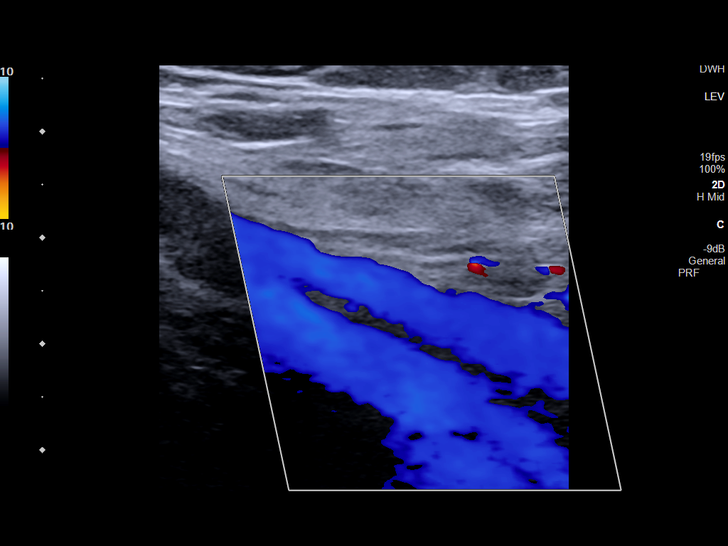
[im 26/33]
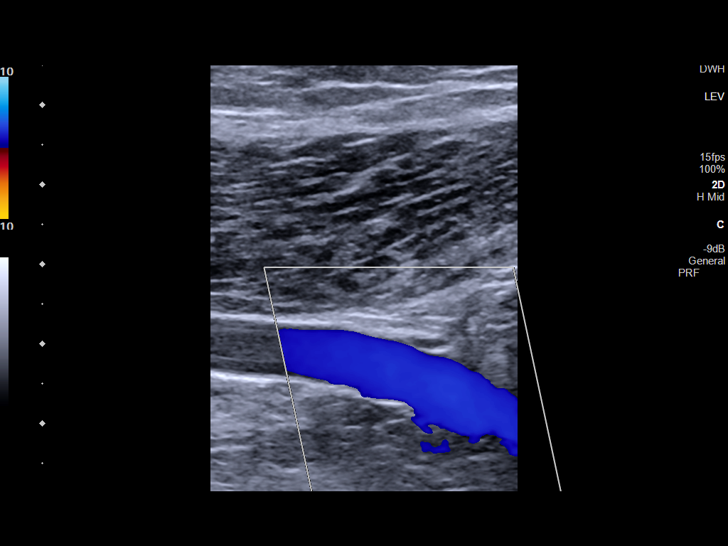
[im 27/33]
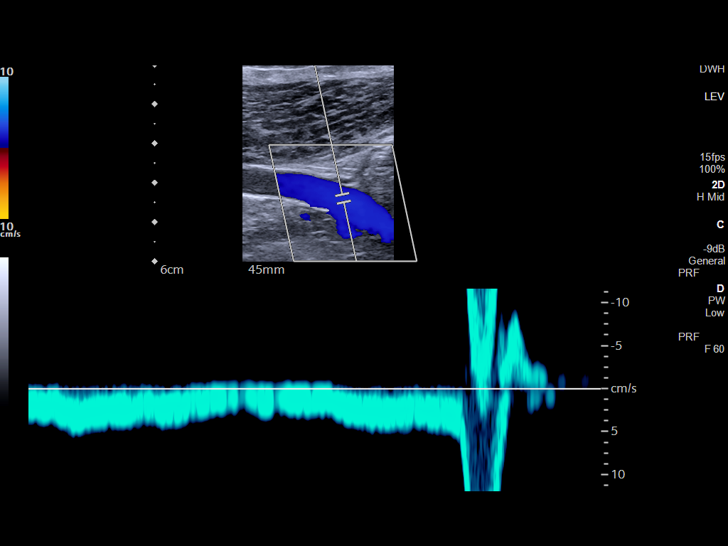
[im 30/33]
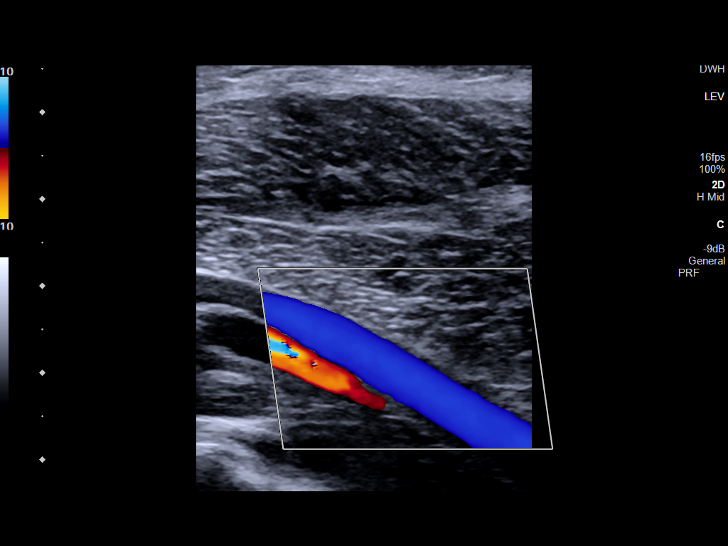
[im 33/33]
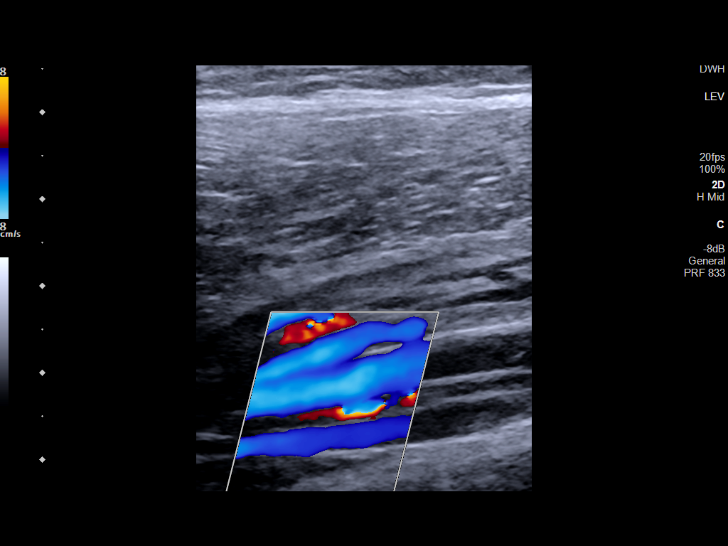

[14 of 24 positions shown; findings below may reference images not displayed]

FINDINGS: VENOUS

Normal compressibility of the common femoral, superficial femoral,
and popliteal veins, as well as the visualized calf veins.
Visualized portions of profunda femoral vein and great saphenous
vein unremarkable. No filling defects to suggest DVT on grayscale or
color Doppler imaging. Doppler waveforms show normal direction of
venous flow, normal respiratory plasticity and response to
augmentation.

Limited views of the contralateral common femoral vein are
unremarkable.

OTHER

None.

Limitations: none
IMPRESSION: Negative.

## 2022-12-25 ENCOUNTER — Ambulatory Visit
Admission: RE | Admit: 2022-12-25 | Discharge: 2022-12-25 | Disposition: A | Discharge status: Home / Self Care | Payer: Medicaid Other | Source: Ambulatory Visit | Attending: Emergency Medicine | Admitting: Emergency Medicine

## 2022-12-25 ENCOUNTER — Ambulatory Visit: Payer: Medicaid Other

## 2022-12-25 ENCOUNTER — Telehealth: Payer: Self-pay | Admitting: Emergency Medicine

## 2022-12-25 VITALS — BP 98/66 | HR 85 | Temp 98.2°F | Resp 18

## 2022-12-25 DIAGNOSIS — H6692 Otitis media, unspecified, left ear: Secondary | ICD-10-CM | POA: Diagnosis not present

## 2022-12-25 DIAGNOSIS — J0100 Acute maxillary sinusitis, unspecified: Secondary | ICD-10-CM

## 2022-12-25 MED ORDER — AMOXICILLIN 875 MG PO TABS: 875.0000 mg | ORAL_TABLET | Freq: Two times a day (BID) | ORAL | 0 refills | Status: AC

## 2022-12-25 NOTE — Discharge Instructions (Addendum)
Take the amoxicillin as directed.  Follow up with your primary care provider if your symptoms are not improving.   ° ° °

## 2022-12-25 NOTE — ED Triage Notes (Signed)
Patient to Urgent Care with complaints of left sided ear pain that started yesterday. Poor sleep due to discomfort. Reports she has had a URI for two weeks.   Taking over the counter cold/ flu medications.  Denies any known fevers. Negative home covid test.

## 2022-12-25 NOTE — Telephone Encounter (Signed)
Left message for patient to call back re: possible Diflucan for history of yeast infection with antibiotic use.

## 2022-12-25 NOTE — ED Provider Notes (Signed)
[facility]    CSN: [id number] Arrival date & time: 12/25/22  0802      History   Chief Complaint Chief Complaint  Patient presents with   Ear Injury    Possible ear infection - Entered by patient    HPI Kimberly Ford is a 32 y.o. female. Patient presents with left ear pain x 1 day.  She reports cough, sinus congestion, sinus pressure, runny nose x 2 weeks.  No fever, rash, sore throat, shortness of breath, or other symptoms.  Treatment with OTC cold medication.  No pertinent medical history.   The history is provided by the patient and medical records.    Past Medical History:  Diagnosis Date   No known health problems     Patient Active Problem List   Diagnosis Date Noted   Abdominal pain during pregnancy, second trimester 03/28/2021   [redacted] weeks gestation of pregnancy 03/28/2021    Past Surgical History:  Procedure Laterality Date   CESAREAN SECTION      OB History     Gravida  2   Para  1   Term  1   Preterm      AB      Living  1      SAB      IAB      Ectopic      Multiple      Live Births  1            Home Medications    Prior to Admission medications   Medication Sig Start Date End Date Taking? Authorizing Provider  amoxicillin (AMOXIL) 875 MG tablet Take 1 tablet (875 mg total) by mouth 2 (two) times daily for 10 days. 12/25/22 01/04/23 Yes Sharion Balloon, NP  CALCIUM PO Take by mouth.    [provider]  ipratropium (ATROVENT) 0.06 % nasal spray Place 2 sprays into both nostrils 4 (four) times daily. 12/28/21   Margarette Canada, NP  Magnesium 200 MG TABS Take by mouth.    [provider]  Multiple Vitamin (MULTIVITAMIN) capsule Take 1 capsule by mouth daily.    [provider]  predniSONE (STERAPRED UNI-PAK 21 TAB) 10 MG (21) TBPK tablet Take by mouth daily. Take 6 tabs by mouth daily  for 2 days, then 5 tabs for 2 days, then 4 tabs for 2 days, then 3 tabs for 2 days, 2 tabs for 2 days, then 1 tab by  mouth daily for 2 days 07/19/22   Hans Eden, NP  ranitidine (ZANTAC) 150 MG tablet Take by mouth.    [provider]  triamcinolone (NASACORT) 55 MCG/ACT AERO nasal inhaler Place 2 sprays into the nose daily. 01/07/22   Danton Clap, PA-C    Family History Family History  Problem Relation Age of Onset   Cancer Neg Hx    Ovarian cancer Neg Hx    Breast cancer Neg Hx     Social History Social History   Tobacco Use   Smoking status: Never    Passive exposure: Never   Smokeless tobacco: Never  Vaping Use   Vaping Use: Never used  Substance Use Topics   Alcohol use: Not Currently   Drug use: Never     Allergies   Patient has no known allergies.   Review of Systems Review of Systems  Constitutional:  Negative for chills and fever.  HENT:  Positive for congestion, ear pain, postnasal drip, rhinorrhea and sinus pressure. Negative  for sore throat.   Respiratory:  Positive for cough. Negative for shortness of breath.   Cardiovascular:  Negative for chest pain and palpitations.  Gastrointestinal:  Negative for diarrhea and vomiting.  Skin:  Negative for color change and rash.  All other systems reviewed and are negative.    Physical Exam Triage Vital Signs ED Triage Vitals  Enc Vitals Group     BP      Pulse      Resp      Temp      Temp src      SpO2      Weight      Height      Head Circumference      Peak Flow      Pain Score      Pain Loc      Pain Edu?      Excl. in [place]?    No data found.  Updated Vital Signs BP 98/66   Pulse 85   Temp 98.2 F (36.8 C)   Resp 18   LMP 12/08/2022   SpO2 98%   Visual Acuity Right Eye Distance:   Left Eye Distance:   Bilateral Distance:    Right Eye Near:   Left Eye Near:    Bilateral Near:     Physical Exam Vitals and nursing note reviewed.  Constitutional:      General: She is not in acute distress.    Appearance: Normal appearance. She is well-developed. She is not ill-appearing.   HENT:     Right Ear: Tympanic membrane and ear canal normal.     Left Ear: Ear canal normal. Tympanic membrane is erythematous.     Nose: Congestion and rhinorrhea present.     Mouth/Throat:     Mouth: Mucous membranes are moist.     Pharynx: Oropharynx is clear.  Cardiovascular:     Rate and Rhythm: Normal rate and regular rhythm.     Heart sounds: Normal heart sounds.  Pulmonary:     Effort: Pulmonary effort is normal. No respiratory distress.     Breath sounds: Normal breath sounds.  Musculoskeletal:     Cervical back: Neck supple.  Skin:    General: Skin is warm and dry.  Neurological:     Mental Status: She is alert.  Psychiatric:        Mood and Affect: Mood normal.        Behavior: Behavior normal.      UC Treatments / Results  Labs (all labs ordered are listed, but only abnormal results are displayed) Labs Reviewed - No data to display  EKG   Radiology No results found.  Procedures Procedures (including critical care time)  Medications Ordered in UC Medications - No data to display  Initial Impression / Assessment and Plan / UC Course  I have reviewed the triage vital signs and the nursing notes.  Pertinent labs & imaging results that were available during my care of the patient were reviewed by me and considered in my medical decision making (see chart for details).   Left otitis media, acute sinusitis.  Treating with amoxicillin.  Discussed symptomatic treatment including Tylenol or ibuprofen.  Instructed patient to follow up with PCP if symptoms are not improving.  Education provided on ear infection and sinus infection.  She agrees to plan of care.    Final Clinical Impressions(s) / UC Diagnoses   Final diagnoses:  Left otitis media, unspecified otitis media type  Acute  non-recurrent maxillary sinusitis     Discharge Instructions      Take the amoxicillin as directed.  Follow up with your primary care provider if your symptoms are not  improving.        ED Prescriptions     Medication Sig Dispense Auth. Provider   amoxicillin (AMOXIL) 875 MG tablet Take 1 tablet (875 mg total) by mouth 2 (two) times daily for 10 days. 20 tablet Sharion Balloon, NP      PDMP not reviewed this encounter.   Sharion Balloon, NP 12/25/22 [phone]

## 2023-09-29 ENCOUNTER — Other Ambulatory Visit: Payer: Self-pay | Admitting: Podiatry

## 2023-10-01 ENCOUNTER — Other Ambulatory Visit: Payer: Self-pay

## 2023-10-01 ENCOUNTER — Encounter
Admission: RE | Admit: 2023-10-01 | Discharge: 2023-10-01 | Disposition: A | Discharge status: Home / Self Care | Payer: Medicaid Other | Source: Ambulatory Visit | Attending: Podiatry | Admitting: Podiatry

## 2023-10-01 DIAGNOSIS — Z01812 Encounter for preprocedural laboratory examination: Secondary | ICD-10-CM

## 2023-10-01 HISTORY — DX: Dermatitis, unspecified: L30.9

## 2023-10-01 HISTORY — DX: Nausea with vomiting, unspecified: Z98.890

## 2023-10-01 HISTORY — DX: Unspecified sexually transmitted disease: A64

## 2023-10-01 HISTORY — DX: Other deformities of toe(s) (acquired), right foot: M20.5X1

## 2023-10-01 HISTORY — DX: Headache, unspecified: R51.9

## 2023-10-01 HISTORY — DX: Attention-deficit hyperactivity disorder, unspecified type: F90.9

## 2023-10-01 HISTORY — DX: Other specified postprocedural states: R11.2

## 2023-10-01 NOTE — Patient Instructions (Addendum)
Your procedure is scheduled on: 10/08/23 - Friday Report to the Registration Desk on the 1st floor of the [facility]. To find out your arrival time, please call [phone] between 1PM - 3PM on: 10/07/23 - Thursday If your arrival time is 6:00 am, do not arrive before that time as the [facility] entrance doors do not open until 6:00 am.  REMEMBER: Instructions that are not followed completely may result in serious medical risk, up to and including death; or upon the discretion of your surgeon and anesthesiologist your surgery may need to be rescheduled.  Do not eat food after midnight the night before surgery.  No gum chewing or hard candies.  You may however, drink CLEAR liquids up to 2 hours before you are scheduled to arrive for your surgery. Do not drink anything within 2 hours of your scheduled arrival time.  Clear liquids include: - water  - apple juice without pulp - gatorade (not RED colors) - black coffee or tea (Do NOT add milk or creamers to the coffee or tea) Do NOT drink anything that is not on this list.  In addition, your doctor has ordered for you to drink the provided:  Ensure Pre-Surgery Clear Carbohydrate Drink  Drinking this carbohydrate drink up to two hours before surgery helps to reduce insulin resistance and improve patient outcomes. Please complete drinking 2 hours before scheduled arrival time.  One week prior to surgery: Stop Anti-inflammatories (NSAIDS) such as Advil, Aleve, Ibuprofen, Motrin, Naproxen, Naprosyn and Aspirin based products such as Excedrin, Goody's Powder, BC Powder. You may take Tylenol if needed for pain up until the day of surgery.  Stop ANY OVER THE COUNTER supplements until after surgery : Biotin  ON THE DAY OF SURGERY ONLY TAKE THESE MEDICATIONS WITH SIPS OF WATER:  none   No Alcohol for 24 hours before or after surgery.  No Smoking including e-cigarettes for 24 hours before surgery.  No chewable tobacco products for at  least 6 hours before surgery.  No nicotine patches on the day of surgery.  Do not use any "recreational" drugs for at least a week (preferably 2 weeks) before your surgery.  Please be advised that the combination of cocaine and anesthesia may have negative outcomes, up to and including death. If you test positive for cocaine, your surgery will be cancelled.  On the morning of surgery brush your teeth with toothpaste and water, you may rinse your mouth with mouthwash if you wish. Do not swallow any toothpaste or mouthwash.  Shower with our CHG soap on the night before surgery and the morning of. See instructions below on how to use Chlorhexidine Gluconate (CHG) Soap.  Do not wear jewelry, make-up, hairpins, clips or nail polish.  For welded (permanent) jewelry: bracelets, anklets, waist bands, etc.  Please have this removed prior to surgery.  If it is not removed, there is a chance that hospital personnel will need to cut it off on the day of surgery.  Do not wear lotions, powders, or perfumes.   Do not shave body hair from the neck down 48 hours before surgery.  Contact lenses, hearing aids and dentures may not be worn into surgery.  Do not bring valuables to the hospital. [facility] is not responsible for any missing/lost belongings or valuables.   Notify your doctor if there is any change in your medical condition (cold, fever, infection).  Wear comfortable clothing (specific to your surgery type) to the hospital.  After surgery, you can  help prevent lung complications by doing breathing exercises.  Take deep breaths and cough every 1-2 hours. Your doctor may order a device called an Incentive Spirometer to help you take deep breaths. When coughing or sneezing, hold a pillow firmly against your incision with both hands. This is called "splinting." Doing this helps protect your incision. It also decreases belly discomfort.  If you are being admitted to the hospital overnight, leave  your suitcase in the car. After surgery it may be brought to your room.  In case of increased patient census, it may be necessary for you, the patient, to continue your postoperative care in the Same Day Surgery department.  If you are being discharged the day of surgery, you will not be allowed to drive home. You will need a responsible individual to drive you home and stay with you for 24 hours after surgery.   If you are taking public transportation, you will need to have a responsible individual with you.  Please call the Pre-admissions Testing Dept. at [phone] if you have any questions about these instructions.  Surgery Visitation Policy:  Patients having surgery or a procedure may have two visitors.  Children under the age of 31 must have an adult with them who is not the patient.  Inpatient Visitation:    Visiting hours are 7 a.m. to 8 p.m. Up to four visitors are allowed at one time in a patient room. The visitors may rotate out with other people during the day.  One visitor age 36 or older may stay with the patient overnight and must be in the room by 8 p.m.     Preparing for Surgery with CHLORHEXIDINE GLUCONATE (CHG) Soap  Chlorhexidine Gluconate (CHG) Soap  o An antiseptic cleaner that kills germs and bonds with the skin to continue killing germs even after washing  o Used for showering the night before surgery and morning of surgery  Before surgery, you can play an important role by reducing the number of germs on your skin.  CHG (Chlorhexidine gluconate) soap is an antiseptic cleanser which kills germs and bonds with the skin to continue killing germs even after washing.  Please do not use if you have an allergy to CHG or antibacterial soaps. If your skin becomes reddened/irritated stop using the CHG.  1. Shower the NIGHT BEFORE SURGERY and the MORNING OF SURGERY with CHG soap.  2. If you choose to wash your hair, wash your hair first as usual with your  normal shampoo.  3. After shampooing, rinse your hair and body thoroughly to remove the shampoo.  4. Use CHG as you would any other liquid soap. You can apply CHG directly to the skin and wash gently with a scrungie or a clean washcloth.  5. Apply the CHG soap to your body only from the neck down. Do not use on open wounds or open sores. Avoid contact with your eyes, ears, mouth, and genitals (private parts). Wash face and genitals (private parts) with your normal soap.  6. Wash thoroughly, paying special attention to the area where your surgery will be performed.  7. Thoroughly rinse your body with warm water.  8. Do not shower/wash with your normal soap after using and rinsing off the CHG soap.  9. Pat yourself dry with a clean towel.  10. Wear clean pajamas to bed the night before surgery.  12. Place clean sheets on your bed the night of your first shower and do not sleep with pets.  13. Shower again with the CHG soap on the day of surgery prior to arriving at the hospital.  14. Do not apply any deodorants/lotions/powders.  15. Please wear clean clothes to the hospital.

## 2023-10-07 MED ORDER — LACTATED RINGERS IV SOLN: INTRAVENOUS | Status: DC

## 2023-10-07 MED ORDER — ORAL CARE MOUTH RINSE: 15.0000 mL | Freq: Once | OROMUCOSAL | Status: AC

## 2023-10-07 MED ORDER — CEFAZOLIN SODIUM-DEXTROSE 2-4 GM/100ML-% IV SOLN
2.0000 g | INTRAVENOUS | Status: AC
  Administered 2023-10-08: 2 g via INTRAVENOUS

## 2023-10-07 MED ORDER — CHLORHEXIDINE GLUCONATE 0.12 % MT SOLN
15.0000 mL | Freq: Once | OROMUCOSAL | Status: AC
  Administered 2023-10-08: 15 mL via OROMUCOSAL

## 2023-10-08 ENCOUNTER — Ambulatory Visit: Payer: Medicaid Other

## 2023-10-08 ENCOUNTER — Encounter
Admission: RE | Disposition: A | Discharge status: Home / Self Care | Payer: Self-pay | Source: Home / Self Care | Attending: Podiatry

## 2023-10-08 ENCOUNTER — Other Ambulatory Visit: Payer: Self-pay

## 2023-10-08 ENCOUNTER — Ambulatory Visit
Admission: RE | Admit: 2023-10-08 | Discharge: 2023-10-08 | Disposition: A | Discharge status: Home / Self Care | Payer: Medicaid Other | Attending: Podiatry | Admitting: Podiatry

## 2023-10-08 ENCOUNTER — Encounter: Payer: Self-pay | Admitting: Podiatry

## 2023-10-08 ENCOUNTER — Ambulatory Visit: Payer: Medicaid Other | Admitting: Anesthesiology

## 2023-10-08 DIAGNOSIS — M205X1 Other deformities of toe(s) (acquired), right foot: Secondary | ICD-10-CM | POA: Insufficient documentation

## 2023-10-08 DIAGNOSIS — M205X2 Other deformities of toe(s) (acquired), left foot: Secondary | ICD-10-CM | POA: Insufficient documentation

## 2023-10-08 DIAGNOSIS — Z01812 Encounter for preprocedural laboratory examination: Secondary | ICD-10-CM

## 2023-10-08 HISTORY — PX: CHEILECTOMY: SHX1336

## 2023-10-08 LAB — POCT PREGNANCY, URINE: Preg Test, Ur: NEGATIVE

## 2023-10-08 SURGERY — CHEILECTOMY
Anesthesia: General | Site: Foot | Laterality: Bilateral

## 2023-10-08 MED ORDER — METOCLOPRAMIDE HCL 10 MG PO TABS
5.0000 mg | ORAL_TABLET | Freq: Three times a day (TID) | ORAL | Status: DC | PRN
Start: 2023-10-08 — End: 2023-10-08

## 2023-10-08 MED ORDER — ACETAMINOPHEN 10 MG/ML IV SOLN: 1000.0000 mg | Freq: Once | INTRAVENOUS | Status: DC | PRN

## 2023-10-08 MED ORDER — MIDAZOLAM HCL 2 MG/2ML IJ SOLN
INTRAMUSCULAR | Status: AC
Start: 2023-10-08 — End: ?
  Filled 2023-10-08: qty 2

## 2023-10-08 MED ORDER — ONDANSETRON HCL 4 MG PO TABS: 4.0000 mg | ORAL_TABLET | Freq: Four times a day (QID) | ORAL | Status: DC | PRN

## 2023-10-08 MED ORDER — FENTANYL CITRATE (PF) 100 MCG/2ML IJ SOLN: 25.0000 ug | INTRAMUSCULAR | Status: DC | PRN

## 2023-10-08 MED ORDER — PHENYLEPHRINE 80 MCG/ML (10ML) SYRINGE FOR IV PUSH (FOR BLOOD PRESSURE SUPPORT)
PREFILLED_SYRINGE | INTRAVENOUS | Status: AC
  Filled 2023-10-08: qty 10

## 2023-10-08 MED ORDER — FENTANYL CITRATE (PF) 100 MCG/2ML IJ SOLN
INTRAMUSCULAR | Status: AC
  Filled 2023-10-08: qty 2

## 2023-10-08 MED ORDER — LIDOCAINE HCL (CARDIAC) PF 100 MG/5ML IV SOSY
PREFILLED_SYRINGE | INTRAVENOUS | Status: DC | PRN
  Administered 2023-10-08: 60 mg via INTRAVENOUS

## 2023-10-08 MED ORDER — DROPERIDOL 2.5 MG/ML IJ SOLN: 0.6250 mg | Freq: Once | INTRAMUSCULAR | Status: DC | PRN

## 2023-10-08 MED ORDER — BUPIVACAINE-EPINEPHRINE (PF) 0.25% -1:200000 IJ SOLN
INTRAMUSCULAR | Status: DC | PRN
  Administered 2023-10-08: 15 mL

## 2023-10-08 MED ORDER — OXYCODONE HCL 5 MG PO TABS: 5.0000 mg | ORAL_TABLET | Freq: Once | ORAL | Status: DC | PRN

## 2023-10-08 MED ORDER — PROPOFOL 500 MG/50ML IV EMUL
INTRAVENOUS | Status: DC | PRN
  Administered 2023-10-08: 125 ug/kg/min via INTRAVENOUS
  Administered 2023-10-08: 60 mg via INTRAVENOUS

## 2023-10-08 MED ORDER — ONDANSETRON HCL 4 MG/2ML IJ SOLN: 4.0000 mg | Freq: Four times a day (QID) | INTRAMUSCULAR | Status: DC | PRN

## 2023-10-08 MED ORDER — CHLORHEXIDINE GLUCONATE 0.12 % MT SOLN
OROMUCOSAL | Status: AC
  Filled 2023-10-08: qty 15

## 2023-10-08 MED ORDER — DEXMEDETOMIDINE HCL IN NACL 80 MCG/20ML IV SOLN
INTRAVENOUS | Status: DC | PRN
  Administered 2023-10-08: 10 ug via INTRAVENOUS

## 2023-10-08 MED ORDER — OXYCODONE HCL 5 MG/5ML PO SOLN: 5.0000 mg | Freq: Once | ORAL | Status: DC | PRN

## 2023-10-08 MED ORDER — FENTANYL CITRATE (PF) 100 MCG/2ML IJ SOLN
INTRAMUSCULAR | Status: DC | PRN
  Administered 2023-10-08 (×4): 25 ug via INTRAVENOUS

## 2023-10-08 MED ORDER — DEXAMETHASONE SODIUM PHOSPHATE 10 MG/ML IJ SOLN
INTRAMUSCULAR | Status: DC | PRN
  Administered 2023-10-08: 10 mg via INTRAVENOUS

## 2023-10-08 MED ORDER — BUPIVACAINE-EPINEPHRINE (PF) 0.25% -1:200000 IJ SOLN
INTRAMUSCULAR | Status: AC
  Filled 2023-10-08: qty 30

## 2023-10-08 MED ORDER — ONDANSETRON HCL 4 MG/2ML IJ SOLN
INTRAMUSCULAR | Status: DC | PRN
  Administered 2023-10-08: 4 mg via INTRAVENOUS

## 2023-10-08 MED ORDER — OXYCODONE-ACETAMINOPHEN 5-325 MG PO TABS: 1.0000 | ORAL_TABLET | Freq: Four times a day (QID) | ORAL | 0 refills | Status: AC | PRN

## 2023-10-08 MED ORDER — CEFAZOLIN SODIUM-DEXTROSE 2-4 GM/100ML-% IV SOLN
INTRAVENOUS | Status: AC
  Filled 2023-10-08: qty 100

## 2023-10-08 MED ORDER — MIDAZOLAM HCL 2 MG/2ML IJ SOLN
INTRAMUSCULAR | Status: DC | PRN
  Administered 2023-10-08: 2 mg via INTRAVENOUS

## 2023-10-08 MED ORDER — PHENYLEPHRINE 80 MCG/ML (10ML) SYRINGE FOR IV PUSH (FOR BLOOD PRESSURE SUPPORT)
PREFILLED_SYRINGE | INTRAVENOUS | Status: DC | PRN
  Administered 2023-10-08 (×4): 80 ug via INTRAVENOUS

## 2023-10-08 MED ORDER — BUPIVACAINE HCL (PF) 0.25 % IJ SOLN
INTRAMUSCULAR | Status: AC
  Filled 2023-10-08: qty 30

## 2023-10-08 MED ORDER — BUPIVACAINE LIPOSOME 1.3 % IJ SUSP
INTRAMUSCULAR | Status: AC
  Filled 2023-10-08: qty 20

## 2023-10-08 MED ORDER — 0.9 % SODIUM CHLORIDE (POUR BTL) OPTIME
TOPICAL | Status: DC | PRN
  Administered 2023-10-08: 500 mL

## 2023-10-08 MED ORDER — METOCLOPRAMIDE HCL 5 MG/ML IJ SOLN: 5.0000 mg | Freq: Three times a day (TID) | INTRAMUSCULAR | Status: DC | PRN

## 2023-10-08 MED ORDER — DEXMEDETOMIDINE HCL IN NACL 80 MCG/20ML IV SOLN
INTRAVENOUS | Status: AC
  Filled 2023-10-08: qty 20

## 2023-10-08 MED ORDER — PROPOFOL 1000 MG/100ML IV EMUL
INTRAVENOUS | Status: AC
  Filled 2023-10-08: qty 100

## 2023-10-08 SURGICAL SUPPLY — 46 items
BLADE MED AGGRESSIVE (BLADE) ×1 IMPLANT
BLADE OSC/SAGITTAL MD 9X18.5 (BLADE) IMPLANT
BLADE SURG 15 STRL LF DISP TIS (BLADE) ×2 IMPLANT
BLADE SURG MINI STRL (BLADE) ×1 IMPLANT
BNDG COHESIVE 4X5 TAN STRL LF (GAUZE/BANDAGES/DRESSINGS) IMPLANT
BNDG ELASTIC 4X5.8 VLCR NS LF (GAUZE/BANDAGES/DRESSINGS) ×1 IMPLANT
BNDG ESMARCH 4X12 STRL LF (GAUZE/BANDAGES/DRESSINGS) ×1 IMPLANT
BNDG GAUZE DERMACEA FLUFF 4 (GAUZE/BANDAGES/DRESSINGS) ×1 IMPLANT
BNDG STRETCH GAUZE 3IN X12FT (GAUZE/BANDAGES/DRESSINGS) ×1 IMPLANT
CUFF TOURN SGL QUICK 12 (TOURNIQUET CUFF) IMPLANT
CUFF TOURN SGL QUICK 18X4 (TOURNIQUET CUFF) IMPLANT
DRAPE BILAT LIMB 76X120 89291 (MISCELLANEOUS) IMPLANT
DRAPE FLUOR MINI C-ARM 54X84 (DRAPES) ×1 IMPLANT
DURAPREP 26ML APPLICATOR (WOUND CARE) ×1 IMPLANT
ELECT REM PT RETURN 9FT ADLT (ELECTROSURGICAL) ×1
ELECTRODE REM PT RTRN 9FT ADLT (ELECTROSURGICAL) ×1 IMPLANT
GAUZE SPONGE 4X4 12PLY STRL (GAUZE/BANDAGES/DRESSINGS) ×1 IMPLANT
GAUZE STRETCH 2X75IN STRL (MISCELLANEOUS) ×1 IMPLANT
GAUZE XEROFORM 1X8 LF (GAUZE/BANDAGES/DRESSINGS) ×1 IMPLANT
GLOVE BIO SURGEON STRL SZ7.5 (GLOVE) ×1 IMPLANT
GLOVE INDICATOR 8.0 STRL GRN (GLOVE) ×1 IMPLANT
GOWN STRL REUS W/ TWL XL LVL3 (GOWN DISPOSABLE) ×2 IMPLANT
KIT TURNOVER KIT A (KITS) ×1 IMPLANT
LABEL OR SOLS (LABEL) ×1 IMPLANT
MANIFOLD NEPTUNE II (INSTRUMENTS) ×1 IMPLANT
NDL FILTER BLUNT 18X1 1/2 (NEEDLE) ×1 IMPLANT
NDL HYPO 25X1 1.5 SAFETY (NEEDLE) ×2 IMPLANT
NEEDLE FILTER BLUNT 18X1 1/2 (NEEDLE) IMPLANT
NEEDLE HYPO 25X1 1.5 SAFETY (NEEDLE) ×2 IMPLANT
NS IRRIG 500ML POUR BTL (IV SOLUTION) ×1 IMPLANT
PACK EXTREMITY ARMC (MISCELLANEOUS) ×1 IMPLANT
RASP SM TEAR CROSS CUT (RASP) ×1 IMPLANT
STOCKINETTE IMPERVIOUS 9X36 MD (GAUZE/BANDAGES/DRESSINGS) ×1 IMPLANT
STOCKINETTE M/LG 89821 (MISCELLANEOUS) ×1 IMPLANT
STRIP CLOSURE SKIN 1/4X4 (GAUZE/BANDAGES/DRESSINGS) ×1 IMPLANT
SUT MNCRL 4-0 27XMFL (SUTURE) ×1
SUT MNCRL+ 5-0 UNDYED PC-3 (SUTURE) ×1 IMPLANT
SUT VIC AB 3-0 SH 27X BRD (SUTURE) ×1 IMPLANT
SUT VIC AB 4-0 FS2 27 (SUTURE) ×1 IMPLANT
SUT VIC AB 4-0 SH 27XANBCTRL (SUTURE) IMPLANT
SUTURE MNCRL 4-0 27XMF (SUTURE) IMPLANT
SWABSTK COMLB BENZOIN TINCTURE (MISCELLANEOUS) ×1 IMPLANT
SYR 10ML LL (SYRINGE) ×1 IMPLANT
TRAP FLUID SMOKE EVACUATOR (MISCELLANEOUS) ×1 IMPLANT
WATER STERILE IRR 1000ML POUR (IV SOLUTION) ×1 IMPLANT
WATER STERILE IRR 500ML POUR (IV SOLUTION) ×1 IMPLANT

## 2023-10-08 NOTE — Anesthesia Postprocedure Evaluation (Signed)
Anesthesia Post Note  Patient: Kimberly Ford  Procedure(s) Performed: CHEILECTOMY (Bilateral: Foot)  Patient location during evaluation: PACU Anesthesia Type: General Level of consciousness: awake and alert Pain management: pain level controlled Vital Signs Assessment: post-procedure vital signs reviewed and stable Respiratory status: spontaneous breathing, nonlabored ventilation and respiratory function stable Cardiovascular status: blood pressure returned to baseline and stable Postop Assessment: no apparent nausea or vomiting Anesthetic complications: no   No notable events documented.   Last Vitals:  Vitals:   10/08/23 0915 10/08/23 0939  BP: 107/69 95/63  Pulse: 81 65  Resp: 15 16  Temp:  37.2 C  SpO2: 100% 100%    Last Pain:  Vitals:   10/08/23 0939  TempSrc: Temporal  PainSc: 0-No pain                 Foye Deer

## 2023-10-08 NOTE — Op Note (Signed)
Operative note   Surgeon:Neema Fluegge [employer] forces logistics/support/administrative officer: None     Preop diagnosis:1.  Hallux limitus left first MTPJ 2.  Hallux limitus right first MTPJ    Postop diagnosis: Same    Procedure: 1.  Cheilectomy left first MTPJ 2.  Cheilectomy right first MTPJ 3.  Intraoperative fluoroscopy use without assistance of radiologist    EBL: Minimal    Anesthesia:local and IV sedation.  Local consisted of a one-to-one mixture of 0.25% bupivacaine with epinephrine and Exparel long-acting anesthetic.  Local block was placed around both surgical sites a total of 15 cc was used    Hemostasis: Epinephrine    Specimen: None    Complications: None    Operative indications:Kimberly Ford is an 32 y.o. that presents today for surgical intervention.  The risks/benefits/alternatives/complications have been discussed and consent has been given.    Procedure:  Patient was brought into the OR and placed on the operating table in thesupine position. After anesthesia was obtained the lateral lower extremity was prepped and draped in usual sterile fashion.  Attention was directed to the dorsal medial left and right first MTPJ's were dorsomedial incision was performed.  Incision was taken down to the capsule.  Longitudinal capsulotomy was performed.  Initially the left dorsal first MTPJ was evaluated.  There was noted to be mild to moderate prominence of the dorsal first metatarsal head.  The joint was well-maintained and cartilage intact.  At this time with a power saw the dorsal and dorsal medial eminence was then transected.  This was further smoothed and recontoured with a power rasp.  Inspection of the base of the proximal phalanx did not reveal any prominence.  Good reduction was noted using intraoperative fluoroscopy.  Attention was then directed to the right first MTPJ where the dorsomedial capsulotomy was performed.  The head of the metatarsal was exposed.  Mild to moderate prominence was noted.  This was then  transected with a power saw and smoothed and recontoured with a power rasp.  Inspection of the base of the proximal phalanx did not reveal any prominence.  All areas were irrigated with copious amounts of irrigation.  Final fluoroscopy revealed good removal of the dorsal first metatarsal head at the metatarsophalangeal joint.  At this time after final flush closure was performed with a 4-0 Vicryl for the capsule and subcutaneous tissue and a 4-0 Monocryl for the skin.    Patient tolerated the procedure and anesthesia well.  Was transported from the OR to the PACU with all vital signs stable and vascular status intact. To be discharged per routine protocol.  Will follow up in approximately 1 week in the outpatient clinic.

## 2023-10-08 NOTE — Anesthesia Preprocedure Evaluation (Addendum)
Anesthesia Evaluation  Patient identified by MRN, date of birth, ID band Patient awake    Reviewed: Allergy & Precautions, H&P , NPO status , Patient's Chart, lab work & pertinent test results  History of Anesthesia Complications (+) PONV and history of anesthetic complications  Airway Mallampati: I  TM Distance: >3 FB Neck ROM: full    Dental no notable dental hx.    Pulmonary neg pulmonary ROS   Pulmonary exam normal        Cardiovascular negative cardio ROS Normal cardiovascular exam     Neuro/Psych  Headaches PSYCHIATRIC DISORDERS         GI/Hepatic negative GI ROS, Neg liver ROS,,,  Endo/Other  negative endocrine ROS    Renal/GU negative Renal ROS  negative genitourinary   Musculoskeletal   Abdominal Normal abdominal exam  (+)   Peds  Hematology negative hematology ROS (+)   Anesthesia Other Findings Past Medical History: No date: Acquired hallux limitus of both feet No date: ADHD (attention deficit hyperactivity disorder) No date: Dermatitis No date: Headache No date: PONV (postoperative nausea and vomiting) No date: STD (female)  Past Surgical History: No date: CESAREAN SECTION     Comment:  x 2 No date: WISDOM TOOTH EXTRACTION  BMI    Body Mass Index: 22.14 kg/m      Reproductive/Obstetrics negative OB ROS                             Anesthesia Physical Anesthesia Plan  ASA: 1  Anesthesia Plan: General   Post-op Pain Management: Regional block*   Induction: Intravenous  PONV Risk Score and Plan: Propofol infusion and TIVA  Airway Management Planned: Natural Airway  Additional Equipment:   Intra-op Plan:   Post-operative Plan:   Informed Consent: I have reviewed the patients History and Physical, chart, labs and discussed the procedure including the risks, benefits and alternatives for the proposed anesthesia with the patient or authorized representative  who has indicated his/her understanding and acceptance.     Dental Advisory Given  Plan Discussed with: CRNA and Surgeon  Anesthesia Plan Comments:        Anesthesia Quick Evaluation

## 2023-10-08 NOTE — H&P (Signed)
HISTORY AND PHYSICAL INTERVAL NOTE:  10/08/2023  7:25 AM  Melbourne Abts  has presented today for surgery, with the diagnosis of Bileateral foot pain  Acquired hallux limitus of both feet.  The various methods of treatment have been discussed with the patient.  No guarantees were given.  After consideration of risks, benefits and other options for treatment, the patient has consented to surgery.  I have reviewed the patients' chart and labs.     A history and physical examination was performed in my office.  The patient was reexamined.  There have been no changes to this history and physical examination.  Gwyneth Revels A

## 2023-10-08 NOTE — Transfer of Care (Signed)
Immediate Anesthesia Transfer of Care Note  Patient: Kimberly Ford  Procedure(s) Performed: CHEILECTOMY (Bilateral: Foot)  Patient Location: PACU  Anesthesia Type:General  Level of Consciousness: awake, alert , and oriented  Airway & Oxygen Therapy: Patient Spontanous Breathing and Patient connected to face mask oxygen  Post-op Assessment: Report given to RN and Post -op Vital signs reviewed and stable  Post vital signs: Reviewed and stable  Last Vitals:  Vitals Value Taken Time  BP 101/66 10/08/23 0852  Temp    Pulse 89 10/08/23 0856  Resp 13 10/08/23 0856  SpO2 100 % 10/08/23 0856  Vitals shown include unfiled device data.  Last Pain:  Vitals:   10/08/23 0637  TempSrc: Tympanic  PainSc: 0-No pain         Complications: No notable events documented.

## 2023-10-08 NOTE — Discharge Instructions (Addendum)

## 2023-10-11 ENCOUNTER — Encounter: Payer: Self-pay | Admitting: Podiatry

## 2023-10-19 ENCOUNTER — Encounter: Payer: Self-pay | Admitting: Podiatry

## 2024-03-22 ENCOUNTER — Inpatient Hospital Stay
Admission: RE | Admit: 2024-03-22 | Discharge: 2024-03-22 | Disposition: A | Discharge status: Home / Self Care | Source: Ambulatory Visit | Attending: Family Medicine | Admitting: Family Medicine
